# Patient Record
Sex: Female | Born: 1975 | Hispanic: No | Marital: Single | Smoking: Never smoker
Health system: Southern US, Community
[De-identification: ages and names within clinical notes are randomized; demographics above are authoritative.]

## PROBLEM LIST (undated history)

## (undated) DIAGNOSIS — J45909 Unspecified asthma, uncomplicated: Secondary | ICD-10-CM

## (undated) DIAGNOSIS — F319 Bipolar disorder, unspecified: Secondary | ICD-10-CM

## (undated) DIAGNOSIS — H8109 Meniere's disease, unspecified ear: Secondary | ICD-10-CM

## (undated) DIAGNOSIS — F32A Depression, unspecified: Secondary | ICD-10-CM

## (undated) DIAGNOSIS — D649 Anemia, unspecified: Secondary | ICD-10-CM

## (undated) DIAGNOSIS — F329 Major depressive disorder, single episode, unspecified: Secondary | ICD-10-CM

## (undated) DIAGNOSIS — F419 Anxiety disorder, unspecified: Secondary | ICD-10-CM

## (undated) DIAGNOSIS — E079 Disorder of thyroid, unspecified: Secondary | ICD-10-CM

## (undated) HISTORY — DX: Anemia, unspecified: D64.9

## (undated) HISTORY — DX: Major depressive disorder, single episode, unspecified: F32.9

## (undated) HISTORY — DX: Anxiety disorder, unspecified: F41.9

## (undated) HISTORY — DX: Depression, unspecified: F32.A

---

## 2000-11-10 ENCOUNTER — Other Ambulatory Visit: Admission: RE | Admit: 2000-11-10 | Discharge: 2000-11-10 | Payer: Self-pay | Admitting: Gynecology

## 2001-12-03 ENCOUNTER — Other Ambulatory Visit: Admission: RE | Admit: 2001-12-03 | Discharge: 2001-12-03 | Payer: Self-pay | Admitting: Gynecology

## 2001-12-16 ENCOUNTER — Encounter: Admission: RE | Admit: 2001-12-16 | Discharge: 2002-03-16 | Payer: Self-pay | Admitting: Gynecology

## 2003-06-21 ENCOUNTER — Other Ambulatory Visit: Admission: RE | Admit: 2003-06-21 | Discharge: 2003-06-21 | Payer: Self-pay | Admitting: Gynecology

## 2004-03-04 ENCOUNTER — Emergency Department (HOSPITAL_COMMUNITY): Admission: EM | Admit: 2004-03-04 | Discharge: 2004-03-04 | Payer: Self-pay

## 2005-01-17 ENCOUNTER — Other Ambulatory Visit: Admission: RE | Admit: 2005-01-17 | Discharge: 2005-01-17 | Payer: Self-pay | Admitting: Gynecology

## 2006-02-27 ENCOUNTER — Other Ambulatory Visit: Admission: RE | Admit: 2006-02-27 | Discharge: 2006-02-27 | Payer: Self-pay | Admitting: Gynecology

## 2017-03-22 ENCOUNTER — Encounter (HOSPITAL_COMMUNITY): Payer: Self-pay

## 2017-03-22 ENCOUNTER — Inpatient Hospital Stay (HOSPITAL_COMMUNITY)
Admission: EM | Admit: 2017-03-22 | Discharge: 2017-03-26 | DRG: 418 | Disposition: A | Discharge status: Home / Self Care | Payer: No Typology Code available for payment source | Attending: Surgery | Admitting: Surgery

## 2017-03-22 ENCOUNTER — Emergency Department (HOSPITAL_COMMUNITY): Payer: No Typology Code available for payment source

## 2017-03-22 DIAGNOSIS — K760 Fatty (change of) liver, not elsewhere classified: Secondary | ICD-10-CM | POA: Diagnosis present

## 2017-03-22 DIAGNOSIS — R1010 Upper abdominal pain, unspecified: Secondary | ICD-10-CM | POA: Diagnosis not present

## 2017-03-22 DIAGNOSIS — J45909 Unspecified asthma, uncomplicated: Secondary | ICD-10-CM | POA: Diagnosis present

## 2017-03-22 DIAGNOSIS — E039 Hypothyroidism, unspecified: Secondary | ICD-10-CM | POA: Diagnosis present

## 2017-03-22 DIAGNOSIS — K8000 Calculus of gallbladder with acute cholecystitis without obstruction: Principal | ICD-10-CM | POA: Diagnosis present

## 2017-03-22 DIAGNOSIS — F319 Bipolar disorder, unspecified: Secondary | ICD-10-CM | POA: Diagnosis present

## 2017-03-22 DIAGNOSIS — K819 Cholecystitis, unspecified: Secondary | ICD-10-CM | POA: Diagnosis present

## 2017-03-22 DIAGNOSIS — K810 Acute cholecystitis: Secondary | ICD-10-CM

## 2017-03-22 DIAGNOSIS — R110 Nausea: Secondary | ICD-10-CM

## 2017-03-22 DIAGNOSIS — Z6841 Body Mass Index (BMI) 40.0 and over, adult: Secondary | ICD-10-CM

## 2017-03-22 DIAGNOSIS — Z885 Allergy status to narcotic agent status: Secondary | ICD-10-CM

## 2017-03-22 HISTORY — DX: Unspecified asthma, uncomplicated: J45.909

## 2017-03-22 HISTORY — DX: Bipolar disorder, unspecified: F31.9

## 2017-03-22 HISTORY — DX: Disorder of thyroid, unspecified: E07.9

## 2017-03-22 LAB — URINALYSIS, ROUTINE W REFLEX MICROSCOPIC
Bilirubin Urine: NEGATIVE
GLUCOSE, UA: NEGATIVE mg/dL
HGB URINE DIPSTICK: NEGATIVE
Ketones, ur: NEGATIVE mg/dL
LEUKOCYTES UA: NEGATIVE
Nitrite: NEGATIVE
PROTEIN: NEGATIVE mg/dL
SPECIFIC GRAVITY, URINE: 1.013 (ref 1.005–1.030)
pH: 7 (ref 5.0–8.0)

## 2017-03-22 LAB — CBC
HEMATOCRIT: 40.9 % (ref 36.0–46.0)
HEMOGLOBIN: 13.7 g/dL (ref 12.0–15.0)
MCH: 29.8 pg (ref 26.0–34.0)
MCHC: 33.5 g/dL (ref 30.0–36.0)
MCV: 88.9 fL (ref 78.0–100.0)
PLATELETS: 316 10*3/uL (ref 150–400)
RBC: 4.6 MIL/uL (ref 3.87–5.11)
RDW: 13 % (ref 11.5–15.5)
WBC: 14.8 10*3/uL — AB (ref 4.0–10.5)

## 2017-03-22 LAB — COMPREHENSIVE METABOLIC PANEL
ALT: 38 U/L (ref 14–54)
ANION GAP: 10 (ref 5–15)
AST: 27 U/L (ref 15–41)
Albumin: 4 g/dL (ref 3.5–5.0)
Alkaline Phosphatase: 89 U/L (ref 38–126)
BUN: 5 mg/dL — ABNORMAL LOW (ref 6–20)
CHLORIDE: 109 mmol/L (ref 101–111)
CO2: 18 mmol/L — AB (ref 22–32)
CREATININE: 0.84 mg/dL (ref 0.44–1.00)
Calcium: 9.6 mg/dL (ref 8.9–10.3)
Glucose, Bld: 105 mg/dL — ABNORMAL HIGH (ref 65–99)
POTASSIUM: 4 mmol/L (ref 3.5–5.1)
SODIUM: 137 mmol/L (ref 135–145)
Total Bilirubin: 0.6 mg/dL (ref 0.3–1.2)
Total Protein: 7.2 g/dL (ref 6.5–8.1)

## 2017-03-22 LAB — LITHIUM LEVEL: LITHIUM LVL: 0.58 mmol/L — AB (ref 0.60–1.20)

## 2017-03-22 LAB — I-STAT BETA HCG BLOOD, ED (MC, WL, AP ONLY)

## 2017-03-22 LAB — LIPASE, BLOOD: LIPASE: 26 U/L (ref 11–51)

## 2017-03-22 MED ORDER — FENTANYL CITRATE (PF) 100 MCG/2ML IJ SOLN
50.0000 ug | Freq: Once | INTRAMUSCULAR | Status: AC
  Administered 2017-03-22: 50 ug via INTRAVENOUS
  Filled 2017-03-22: qty 2

## 2017-03-22 MED ORDER — GI COCKTAIL ~~LOC~~
30.0000 mL | Freq: Once | ORAL | Status: AC
  Administered 2017-03-22: 30 mL via ORAL
  Filled 2017-03-22: qty 30

## 2017-03-22 MED ORDER — SODIUM CHLORIDE 0.9 % IV BOLUS (SEPSIS)
1000.0000 mL | Freq: Once | INTRAVENOUS | Status: AC
  Administered 2017-03-22: 1000 mL via INTRAVENOUS

## 2017-03-22 MED ORDER — ONDANSETRON HCL 4 MG/2ML IJ SOLN
4.0000 mg | Freq: Once | INTRAMUSCULAR | Status: AC
  Administered 2017-03-22: 4 mg via INTRAVENOUS
  Filled 2017-03-22: qty 2

## 2017-03-22 NOTE — ED Notes (Signed)
Patient transported to Ultrasound 

## 2017-03-22 NOTE — ED Triage Notes (Signed)
Onset 1 week abd pain, vomiting, diarrhea.   No vomiting since onset, no diarrhea in 3 days. Abd pain worse in past 3 days, made worse with eating.

## 2017-03-22 NOTE — ED Notes (Signed)
Patient updated on delays 

## 2017-03-22 NOTE — ED Provider Notes (Signed)
[facility]-EMERGENCY DEPT Provider Note   CSN: [id number] Arrival date & time: 03/22/17  1734     History   Chief Complaint Chief Complaint  Patient presents with  . Abdominal Pain    HPI Jenna Navarro is a 41 y.o. female.  Jenna Navarro is a 41 y.o. Female who presents to the emergency department complaining of upper abdominal pain with associated nausea and vomiting has been ongoing. Patient reports she's been having intermittent abdominal pain for several weeks it has worsened over the past week. She was last week she had nausea, vomiting diarrhea that is now resolved. She reports over the past week she's had nausea, but no vomiting. She was after eating she feels upper abdominal pain with associated nausea and satiety. Denies any previous abdominal surgeries. She denies any burping or belching. She reports taking his Zantac earlier today with little relief of her symptoms. She denies fevers, chest pain, shortness of breath, hematemesis, vomiting, diarrhea, rashes, urinary symptoms, lower abdominal pain.    The history is provided by the patient, medical records and a parent. No language interpreter was used.  Abdominal Pain   Associated symptoms include nausea. Pertinent negatives include fever, diarrhea, vomiting, dysuria, frequency and headaches.    Past Medical History:  Diagnosis Date  . Asthma   . Bipolar 1 disorder (HCC)   . Thyroid disease    hashimoto     Patient Active Problem List   Diagnosis Date Noted  . Cholecystitis 03/23/2017    History reviewed. No pertinent surgical history.  OB History    No data available       Home Medications    Prior to Admission medications   Medication Sig Start Date End Date Taking? Authorizing Provider  ADVAIR DISKUS 250-50 MCG/DOSE AEPB Inhale 1 puff into the lungs 2 (two) times daily. 03/07/17  Yes [provider]  albuterol (PROVENTIL HFA;VENTOLIN HFA) 108 (90 Base) MCG/ACT inhaler Inhale 1-2 puffs into the  lungs every 6 (six) hours as needed for wheezing or shortness of breath.   Yes [provider]  ARIPiprazole (ABILIFY) 10 MG tablet Take 10 mg by mouth at bedtime. 02/05/17  Yes [provider]  Armodafinil 250 MG tablet Take 250 mg by mouth daily. 03/16/17  Yes [provider]  lamoTRIgine (LAMICTAL) 100 MG tablet Take 300 mg by mouth at bedtime. 02/05/17  Yes [provider]  levothyroxine (SYNTHROID, LEVOTHROID) 150 MCG tablet Take 150 mcg by mouth daily. 03/10/17  Yes [provider]  lithium carbonate (ESKALITH) 450 MG CR tablet Take 1,350 mg by mouth at bedtime. 03/07/17  Yes [provider]  minocycline (DYNACIN) 100 MG tablet Take 100 mg by mouth 2 (two) times daily.   Yes [provider]  montelukast (SINGULAIR) 10 MG tablet Take 10 mg by mouth daily. 01/17/17  Yes [provider]  NORTREL 1/35, 28, tablet Take 1 tablet by mouth at bedtime. 03/07/17  Yes [provider]  risperiDONE (RISPERDAL) 0.25 MG tablet Take 0.75 mg by mouth at bedtime. 02/05/17  Yes [provider]    Family History History reviewed. No pertinent family history.  Social History Social History  Substance Use Topics  . Smoking status: Never Smoker  . Smokeless tobacco: Never Used  . Alcohol use No     Allergies   Codeine   Review of Systems Review of Systems  Constitutional: Negative for chills and fever.  HENT: Negative for congestion and sore throat.   Eyes: Negative  for visual disturbance.  Respiratory: Negative for cough, shortness of breath and wheezing.   Cardiovascular: Negative for chest pain and palpitations.  Gastrointestinal: Positive for abdominal pain and nausea. Negative for blood in stool, diarrhea and vomiting.  Genitourinary: Negative for difficulty urinating, dysuria, frequency and urgency.  Musculoskeletal: Negative for neck pain.  Skin: Negative for rash.  Neurological: Negative for headaches.      Physical Exam Updated Vital Signs BP (!) 149/94   Pulse 79   Temp 98.3 F (36.8 C) (Oral)   Resp 18   Ht 5\' 6"  (1.676 m)   Wt 119.3 kg (263 lb)   LMP 03/10/2017   SpO2 99%   BMI 42.45 kg/m   Physical Exam  Constitutional: She appears well-developed and well-nourished. No distress.  Nontoxic-appearing. Obese female.  HENT:  Head: Normocephalic and atraumatic.  Mouth/Throat: Oropharynx is clear and moist.  Eyes: Pupils are equal, round, and reactive to light. Conjunctivae are normal. Right eye exhibits no discharge. Left eye exhibits no discharge.  Neck: Neck supple. No JVD present.  Cardiovascular: Normal rate, regular rhythm, normal heart sounds and intact distal pulses.  Exam reveals no gallop and no friction rub.   No murmur heard. Pulmonary/Chest: Effort normal and breath sounds normal. No stridor. No respiratory distress. She has no wheezes. She has no rales.  Abdominal: Soft. Bowel sounds are normal. She exhibits no distension and no mass. There is tenderness. There is no rebound and no guarding.  Abdomen is soft. Bowel sounds are present. Patient has epigastric and right upper quadrant abdominal tenderness to palpation. No lower abdominal tenderness. No CVA or flank tenderness.  Musculoskeletal: She exhibits no edema.  Lymphadenopathy:    She has no cervical adenopathy.  Neurological: She is alert. Coordination normal.  Skin: Skin is warm and dry. Capillary refill takes less than 2 seconds. No rash noted. She is not diaphoretic. No erythema. No pallor.  Psychiatric: She has a normal mood and affect. Her behavior is normal.  Nursing note and vitals reviewed.    ED Treatments / Results  Labs (all labs ordered are listed, but only abnormal results are displayed) Labs Reviewed  COMPREHENSIVE METABOLIC PANEL - Abnormal; Notable for the following:       Result Value   CO2 18 (*)    Glucose, Bld 105 (*)    BUN 5 (*)    All other components within normal limits   CBC - Abnormal; Notable for the following:    WBC 14.8 (*)    All other components within normal limits  LITHIUM LEVEL - Abnormal; Notable for the following:    Lithium Lvl 0.58 (*)    All other components within normal limits  LIPASE, BLOOD  URINALYSIS, ROUTINE W REFLEX MICROSCOPIC  I-STAT BETA HCG BLOOD, ED ([facility], [facility], [facility] ONLY)    EKG  EKG Interpretation None       Radiology US Abdomen Limited  Result Date: 03/22/2017 CLINICAL DATA:  Epigastric and right upper quadrant abdominal pain. Nausea and vomiting. EXAM: ULTRASOUND ABDOMEN LIMITED RIGHT UPPER QUADRANT COMPARISON:  None. FINDINGS: Gallbladder: Distended containing intraluminal stones and sludge. There is gallbladder wall thickening of 5 mm. Small amount pericholecystic fluid. A positive sonographic Eulah Pont sign was noted by sonographer. Common bile duct: Diameter: 5 mm, normal. Liver: Diffusely increased and heterogeneous in parenchymal echogenicity. Liver parenchyma is difficult to penetrate. No discrete focal lesion is seen. Portal vein is patent on color Doppler imaging with normal direction of blood flow towards the liver.  IMPRESSION: 1. Acute cholecystitis. Cholelithiasis with gallbladder wall thickening, small volume pericholecystic fluid and positive sonographic Murphy sign. 2. Hepatic steatosis. Electronically Signed   By: Rubye [place] M.D.   On: 03/22/2017 23:28    Procedures Procedures (including critical care time)  Medications Ordered in ED Medications  ARIPiprazole (ABILIFY) tablet 10 mg (10 mg Oral Given 03/23/17 0114)  lamoTRIgine (LAMICTAL) tablet 300 mg (300 mg Oral Given 03/23/17 0114)  risperiDONE (RISPERDAL) tablet 0.75 mg (0.75 mg Oral Given 03/23/17 0114)  sodium chloride 0.9 % bolus 1,000 mL (1,000 mLs Intravenous New Bag/Given 03/22/17 2355)  ondansetron (ZOFRAN) injection 4 mg (4 mg Intravenous Given 03/22/17 2355)  fentaNYL (SUBLIMAZE) injection 50 mcg (50 mcg Intravenous Given 03/22/17 2356)  gi  cocktail (Maalox,Lidocaine,Donnatal) (30 mLs Oral Given 03/22/17 2348)     Initial Impression / Assessment and Plan / ED Course  I have reviewed the triage vital signs and the nursing notes.  Pertinent labs & imaging results that were available during my care of the patient were reviewed by me and considered in my medical decision making (see chart for details).    This is a 41 y.o. Female who presents to the emergency department complaining of upper abdominal pain with associated nausea and vomiting has been ongoing. Patient reports she's been having intermittent abdominal pain for several weeks it has worsened over the past week. She was last week she had nausea, vomiting diarrhea that is now resolved. She reports over the past week she's had nausea, but no vomiting. She was after eating she feels upper abdominal pain with associated nausea and satiety. Denies any previous abdominal surgeries. On exam patient is afebrile nontoxic-appearing. She is epigastric and right upper quadrant tenderness to palpation. Pregnancy test is negative. Urinalysis without signs of infection. Lipase is within normal limits. CMP is unremarkable. Normal liver enzymes. CBC is remarkable for white count of 14,800. Right upper quadrant ultrasound reveals acute cholecystitis with cholelithiasis. I consulted with general surgeon Dr. Andrey Campanile who will admit patient and plans for surgery. Patient agrees with plan. Patient admitted.   Final Clinical Impressions(s) / ED Diagnoses   Final diagnoses:  Acute cholecystitis  Nausea    New Prescriptions New Prescriptions   No medications on file     Lorene Dy 03/23/17 0139    Rolland Porter, MD 04/01/17 [phone]

## 2017-03-22 NOTE — ED Notes (Signed)
Pt concerned with Lithium level.  Has not had checked in 3 months and would like checked today.  Called Dr. Hyacinth Meeker, order obtained.

## 2017-03-23 ENCOUNTER — Encounter (HOSPITAL_COMMUNITY): Admission: EM | Disposition: A | Discharge status: Home / Self Care | Payer: Self-pay | Source: Home / Self Care

## 2017-03-23 ENCOUNTER — Observation Stay (HOSPITAL_COMMUNITY): Payer: No Typology Code available for payment source | Admitting: Anesthesiology

## 2017-03-23 DIAGNOSIS — J45909 Unspecified asthma, uncomplicated: Secondary | ICD-10-CM | POA: Diagnosis present

## 2017-03-23 DIAGNOSIS — E039 Hypothyroidism, unspecified: Secondary | ICD-10-CM | POA: Diagnosis present

## 2017-03-23 DIAGNOSIS — K819 Cholecystitis, unspecified: Secondary | ICD-10-CM | POA: Diagnosis present

## 2017-03-23 DIAGNOSIS — Z6841 Body Mass Index (BMI) 40.0 and over, adult: Secondary | ICD-10-CM | POA: Diagnosis not present

## 2017-03-23 DIAGNOSIS — K8000 Calculus of gallbladder with acute cholecystitis without obstruction: Secondary | ICD-10-CM | POA: Diagnosis present

## 2017-03-23 DIAGNOSIS — K760 Fatty (change of) liver, not elsewhere classified: Secondary | ICD-10-CM | POA: Diagnosis present

## 2017-03-23 DIAGNOSIS — F319 Bipolar disorder, unspecified: Secondary | ICD-10-CM | POA: Diagnosis present

## 2017-03-23 DIAGNOSIS — Z885 Allergy status to narcotic agent status: Secondary | ICD-10-CM | POA: Diagnosis not present

## 2017-03-23 DIAGNOSIS — R1010 Upper abdominal pain, unspecified: Secondary | ICD-10-CM | POA: Diagnosis present

## 2017-03-23 HISTORY — PX: CHOLECYSTECTOMY: SHX55

## 2017-03-23 LAB — COMPREHENSIVE METABOLIC PANEL
ALBUMIN: 3.5 g/dL (ref 3.5–5.0)
ALK PHOS: 125 U/L (ref 38–126)
ALT: 45 U/L (ref 14–54)
AST: 61 U/L — ABNORMAL HIGH (ref 15–41)
Anion gap: 8 (ref 5–15)
BILIRUBIN TOTAL: 0.9 mg/dL (ref 0.3–1.2)
CALCIUM: 8.9 mg/dL (ref 8.9–10.3)
CO2: 19 mmol/L — AB (ref 22–32)
CREATININE: 0.73 mg/dL (ref 0.44–1.00)
Chloride: 108 mmol/L (ref 101–111)
GFR calc Af Amer: >60 mL/min (ref >60)
GFR calc non Af Amer: >60 mL/min (ref >60)
GLUCOSE: 134 mg/dL — AB (ref 65–99)
Potassium: 4 mmol/L (ref 3.5–5.1)
SODIUM: 135 mmol/L (ref 135–145)
Total Protein: 6.7 g/dL (ref 6.5–8.1)

## 2017-03-23 LAB — SURGICAL PCR SCREEN
MRSA, PCR: NEGATIVE
Staphylococcus aureus: NEGATIVE

## 2017-03-23 LAB — CBC
HCT: 40.2 % (ref 36.0–46.0)
Hemoglobin: 13 g/dL (ref 12.0–15.0)
MCH: 29.5 pg (ref 26.0–34.0)
MCHC: 32.3 g/dL (ref 30.0–36.0)
MCV: 91.4 fL (ref 78.0–100.0)
PLATELETS: 257 10*3/uL (ref 150–400)
RBC: 4.4 MIL/uL (ref 3.87–5.11)
RDW: 13.2 % (ref 11.5–15.5)
WBC: 13.6 10*3/uL — ABNORMAL HIGH (ref 4.0–10.5)

## 2017-03-23 SURGERY — LAPAROSCOPIC CHOLECYSTECTOMY
Anesthesia: General | Site: Abdomen

## 2017-03-23 MED ORDER — LITHIUM CARBONATE ER 450 MG PO TBCR: 1350.0000 mg | EXTENDED_RELEASE_TABLET | Freq: Every day | ORAL | Status: DC

## 2017-03-23 MED ORDER — KCL IN DEXTROSE-NACL 20-5-0.45 MEQ/L-%-% IV SOLN
INTRAVENOUS | Status: DC
  Administered 2017-03-23: 03:00:00 via INTRAVENOUS
  Filled 2017-03-23: qty 1000

## 2017-03-23 MED ORDER — FENTANYL CITRATE (PF) 100 MCG/2ML IJ SOLN
25.0000 ug | INTRAMUSCULAR | Status: DC | PRN
  Administered 2017-03-23 (×2): 50 ug via INTRAVENOUS
  Filled 2017-03-23 (×2): qty 2

## 2017-03-23 MED ORDER — ARIPIPRAZOLE 10 MG PO TABS
10.0000 mg | ORAL_TABLET | Freq: Every day | ORAL | Status: DC
  Administered 2017-03-23 – 2017-03-25 (×4): 10 mg via ORAL
  Filled 2017-03-23 (×4): qty 1

## 2017-03-23 MED ORDER — LIDOCAINE HCL (CARDIAC) 20 MG/ML IV SOLN
INTRAVENOUS | Status: DC | PRN
  Administered 2017-03-23: 60 mg via INTRAVENOUS

## 2017-03-23 MED ORDER — SUGAMMADEX SODIUM 200 MG/2ML IV SOLN
INTRAVENOUS | Status: AC
  Filled 2017-03-23: qty 2

## 2017-03-23 MED ORDER — BUPIVACAINE-EPINEPHRINE 0.25% -1:200000 IJ SOLN
INTRAMUSCULAR | Status: AC
  Filled 2017-03-23: qty 1

## 2017-03-23 MED ORDER — FENTANYL CITRATE (PF) 250 MCG/5ML IJ SOLN
INTRAMUSCULAR | Status: AC
  Filled 2017-03-23: qty 5

## 2017-03-23 MED ORDER — IOPAMIDOL (ISOVUE-300) INJECTION 61%
INTRAVENOUS | Status: AC
  Filled 2017-03-23: qty 50

## 2017-03-23 MED ORDER — HYDROMORPHONE HCL 1 MG/ML IJ SOLN
1.0000 mg | INTRAMUSCULAR | Status: DC | PRN
  Administered 2017-03-23 – 2017-03-25 (×7): 1 mg via INTRAVENOUS
  Filled 2017-03-23 (×7): qty 1

## 2017-03-23 MED ORDER — SODIUM CHLORIDE 0.9 % IR SOLN
Status: DC | PRN
  Administered 2017-03-23: 1000 mL

## 2017-03-23 MED ORDER — ROCURONIUM BROMIDE 10 MG/ML (PF) SYRINGE
PREFILLED_SYRINGE | INTRAVENOUS | Status: AC
  Filled 2017-03-23: qty 5

## 2017-03-23 MED ORDER — ONDANSETRON HCL 4 MG/2ML IJ SOLN: 4.0000 mg | Freq: Four times a day (QID) | INTRAMUSCULAR | Status: DC

## 2017-03-23 MED ORDER — ONDANSETRON HCL 4 MG/2ML IJ SOLN
4.0000 mg | Freq: Four times a day (QID) | INTRAMUSCULAR | Status: DC | PRN
Start: 2017-03-23 — End: 2017-03-26

## 2017-03-23 MED ORDER — 0.9 % SODIUM CHLORIDE (POUR BTL) OPTIME
TOPICAL | Status: DC | PRN
  Administered 2017-03-23: 1000 mL

## 2017-03-23 MED ORDER — RISPERIDONE 0.5 MG PO TABS
0.7500 mg | ORAL_TABLET | Freq: Every day | ORAL | Status: DC
  Administered 2017-03-23 – 2017-03-25 (×4): 0.75 mg via ORAL
  Filled 2017-03-23 (×5): qty 1

## 2017-03-23 MED ORDER — FAMOTIDINE 20 MG PO TABS
20.0000 mg | ORAL_TABLET | Freq: Once | ORAL | Status: AC
  Administered 2017-03-23: 20 mg via ORAL
  Filled 2017-03-23: qty 1

## 2017-03-23 MED ORDER — DIPHENHYDRAMINE HCL 12.5 MG/5ML PO ELIX: 12.5000 mg | ORAL_SOLUTION | Freq: Four times a day (QID) | ORAL | Status: DC | PRN

## 2017-03-23 MED ORDER — LITHIUM CARBONATE ER 450 MG PO TBCR
1350.0000 mg | EXTENDED_RELEASE_TABLET | Freq: Every day | ORAL | Status: DC
  Administered 2017-03-23 – 2017-03-25 (×3): 1350 mg via ORAL
  Filled 2017-03-23 (×4): qty 3

## 2017-03-23 MED ORDER — FENTANYL CITRATE (PF) 100 MCG/2ML IJ SOLN
INTRAMUSCULAR | Status: DC | PRN
  Administered 2017-03-23: 150 ug via INTRAVENOUS
  Administered 2017-03-23 (×2): 50 ug via INTRAVENOUS

## 2017-03-23 MED ORDER — PIPERACILLIN-TAZOBACTAM 3.375 G IVPB 30 MIN
3.3750 g | Freq: Once | INTRAVENOUS | Status: AC
  Administered 2017-03-23: 3.375 g via INTRAVENOUS
  Filled 2017-03-23 (×2): qty 50

## 2017-03-23 MED ORDER — ACETAMINOPHEN 650 MG RE SUPP: 650.0000 mg | Freq: Four times a day (QID) | RECTAL | Status: DC | PRN

## 2017-03-23 MED ORDER — PROMETHAZINE HCL 25 MG/ML IJ SOLN: 6.2500 mg | INTRAMUSCULAR | Status: DC | PRN

## 2017-03-23 MED ORDER — HYDROMORPHONE HCL 1 MG/ML IJ SOLN
INTRAMUSCULAR | Status: AC
  Administered 2017-03-23: 0.5 mg via INTRAVENOUS
  Filled 2017-03-23: qty 1

## 2017-03-23 MED ORDER — DEXAMETHASONE SODIUM PHOSPHATE 10 MG/ML IJ SOLN
INTRAMUSCULAR | Status: DC | PRN
  Administered 2017-03-23: 10 mg via INTRAVENOUS

## 2017-03-23 MED ORDER — PROMETHAZINE HCL 25 MG/ML IJ SOLN: 12.5000 mg | Freq: Four times a day (QID) | INTRAMUSCULAR | Status: DC | PRN

## 2017-03-23 MED ORDER — MIDAZOLAM HCL 5 MG/5ML IJ SOLN
INTRAMUSCULAR | Status: DC | PRN
  Administered 2017-03-23: 2 mg via INTRAVENOUS

## 2017-03-23 MED ORDER — OXYCODONE-ACETAMINOPHEN 5-325 MG PO TABS
1.0000 | ORAL_TABLET | ORAL | Status: DC | PRN
  Administered 2017-03-23 – 2017-03-26 (×8): 1 via ORAL
  Filled 2017-03-23 (×10): qty 1

## 2017-03-23 MED ORDER — PIPERACILLIN-TAZOBACTAM 3.375 G IVPB
3.3750 g | Freq: Three times a day (TID) | INTRAVENOUS | Status: DC
  Administered 2017-03-23: 3.375 g via INTRAVENOUS
  Filled 2017-03-23 (×3): qty 50

## 2017-03-23 MED ORDER — PHENYLEPHRINE HCL 10 MG/ML IJ SOLN
INTRAMUSCULAR | Status: DC | PRN
  Administered 2017-03-23: 80 ug via INTRAVENOUS
  Administered 2017-03-23 (×2): 40 ug via INTRAVENOUS
  Administered 2017-03-23: 80 ug via INTRAVENOUS
  Administered 2017-03-23 (×2): 40 ug via INTRAVENOUS

## 2017-03-23 MED ORDER — PROPOFOL 10 MG/ML IV BOLUS
INTRAVENOUS | Status: DC | PRN
  Administered 2017-03-23: 200 mg via INTRAVENOUS

## 2017-03-23 MED ORDER — HEPARIN SODIUM (PORCINE) 5000 UNIT/ML IJ SOLN
5000.0000 [IU] | Freq: Once | INTRAMUSCULAR | Status: AC
  Administered 2017-03-23: 5000 [IU] via SUBCUTANEOUS
  Filled 2017-03-23: qty 1

## 2017-03-23 MED ORDER — ROCURONIUM BROMIDE 100 MG/10ML IV SOLN
INTRAVENOUS | Status: DC | PRN
  Administered 2017-03-23: 10 mg via INTRAVENOUS
  Administered 2017-03-23: 40 mg via INTRAVENOUS
  Administered 2017-03-23: 10 mg via INTRAVENOUS

## 2017-03-23 MED ORDER — LAMOTRIGINE 100 MG PO TABS
300.0000 mg | ORAL_TABLET | Freq: Every day | ORAL | Status: DC
  Administered 2017-03-23 – 2017-03-25 (×4): 300 mg via ORAL
  Filled 2017-03-23: qty 3
  Filled 2017-03-23: qty 2
  Filled 2017-03-23 (×2): qty 3

## 2017-03-23 MED ORDER — PANTOPRAZOLE SODIUM 40 MG IV SOLR: 40.0000 mg | Freq: Every day | INTRAVENOUS | Status: DC

## 2017-03-23 MED ORDER — SUGAMMADEX SODIUM 200 MG/2ML IV SOLN
INTRAVENOUS | Status: DC | PRN
  Administered 2017-03-23: 200 mg via INTRAVENOUS

## 2017-03-23 MED ORDER — ONDANSETRON 4 MG PO TBDP: 4.0000 mg | ORAL_TABLET | Freq: Four times a day (QID) | ORAL | Status: DC | PRN

## 2017-03-23 MED ORDER — ACETAMINOPHEN 325 MG PO TABS: 650.0000 mg | ORAL_TABLET | Freq: Four times a day (QID) | ORAL | Status: DC | PRN

## 2017-03-23 MED ORDER — ALBUTEROL SULFATE (2.5 MG/3ML) 0.083% IN NEBU: 3.0000 mL | INHALATION_SOLUTION | Freq: Four times a day (QID) | RESPIRATORY_TRACT | Status: DC | PRN

## 2017-03-23 MED ORDER — PHENYLEPHRINE 40 MCG/ML (10ML) SYRINGE FOR IV PUSH (FOR BLOOD PRESSURE SUPPORT)
PREFILLED_SYRINGE | INTRAVENOUS | Status: AC
  Filled 2017-03-23: qty 10

## 2017-03-23 MED ORDER — DIPHENHYDRAMINE HCL 50 MG/ML IJ SOLN: 12.5000 mg | Freq: Four times a day (QID) | INTRAMUSCULAR | Status: DC | PRN

## 2017-03-23 MED ORDER — HYDROMORPHONE HCL 1 MG/ML IJ SOLN
0.2500 mg | INTRAMUSCULAR | Status: DC | PRN
  Administered 2017-03-23 (×2): 0.5 mg via INTRAVENOUS

## 2017-03-23 MED ORDER — BUPIVACAINE-EPINEPHRINE 0.25% -1:200000 IJ SOLN
INTRAMUSCULAR | Status: DC | PRN
  Administered 2017-03-23: 10 mL

## 2017-03-23 MED ORDER — LIDOCAINE 2% (20 MG/ML) 5 ML SYRINGE
INTRAMUSCULAR | Status: AC
  Filled 2017-03-23: qty 5

## 2017-03-23 MED ORDER — ONDANSETRON HCL 4 MG/2ML IJ SOLN
4.0000 mg | Freq: Four times a day (QID) | INTRAMUSCULAR | Status: DC | PRN
  Administered 2017-03-23: 4 mg via INTRAVENOUS
  Filled 2017-03-23: qty 2

## 2017-03-23 MED ORDER — MIDAZOLAM HCL 2 MG/2ML IJ SOLN
INTRAMUSCULAR | Status: AC
  Filled 2017-03-23: qty 2

## 2017-03-23 MED ORDER — LACTATED RINGERS IV SOLN
INTRAVENOUS | Status: DC
  Administered 2017-03-23 – 2017-03-24 (×5): via INTRAVENOUS

## 2017-03-23 MED ORDER — SUCCINYLCHOLINE CHLORIDE 20 MG/ML IJ SOLN
INTRAMUSCULAR | Status: DC | PRN
  Administered 2017-03-23: 160 mg via INTRAVENOUS

## 2017-03-23 SURGICAL SUPPLY — 42 items
ADH SKN CLS APL DERMABOND .7 (GAUZE/BANDAGES/DRESSINGS) ×1
APPLIER CLIP ROT 10 11.4 M/L (STAPLE) ×2
APR CLP MED LRG 11.4X10 (STAPLE) ×1
BAG SPEC RTRVL LRG 6X4 10 (ENDOMECHANICALS) ×1
BLADE CLIPPER SURG (BLADE) ×1 IMPLANT
CANISTER SUCT 3000ML PPV (MISCELLANEOUS) ×2 IMPLANT
CHLORAPREP W/TINT 26ML (MISCELLANEOUS) ×2 IMPLANT
CLIP APPLIE ROT 10 11.4 M/L (STAPLE) ×1 IMPLANT
COVER SURGICAL LIGHT HANDLE (MISCELLANEOUS) ×2 IMPLANT
DERMABOND ADVANCED (GAUZE/BANDAGES/DRESSINGS) ×1
DERMABOND ADVANCED .7 DNX12 (GAUZE/BANDAGES/DRESSINGS) ×1 IMPLANT
DRAIN CHANNEL 19F RND (DRAIN) ×1 IMPLANT
DRAPE WARM FLUID 44X44 (DRAPE) ×2 IMPLANT
ELECT REM PT RETURN 9FT ADLT (ELECTROSURGICAL) ×2
ELECTRODE REM PT RTRN 9FT ADLT (ELECTROSURGICAL) ×1 IMPLANT
EVACUATOR SILICONE 100CC (DRAIN) ×1 IMPLANT
GLOVE BIO SURGEON STRL SZ8 (GLOVE) ×2 IMPLANT
GLOVE BIOGEL PI IND STRL 8 (GLOVE) ×1 IMPLANT
GLOVE BIOGEL PI INDICATOR 8 (GLOVE) ×1
GOWN STRL REUS W/ TWL LRG LVL3 (GOWN DISPOSABLE) ×2 IMPLANT
GOWN STRL REUS W/ TWL XL LVL3 (GOWN DISPOSABLE) ×1 IMPLANT
GOWN STRL REUS W/TWL LRG LVL3 (GOWN DISPOSABLE) ×4
GOWN STRL REUS W/TWL XL LVL3 (GOWN DISPOSABLE) ×2
HEMOSTAT SNOW SURGICEL 2X4 (HEMOSTASIS) ×2 IMPLANT
KIT BASIN OR (CUSTOM PROCEDURE TRAY) ×2 IMPLANT
KIT ROOM TURNOVER OR (KITS) ×2 IMPLANT
NS IRRIG 1000ML POUR BTL (IV SOLUTION) ×2 IMPLANT
PAD ARMBOARD 7.5X6 YLW CONV (MISCELLANEOUS) ×2 IMPLANT
POUCH SPECIMEN RETRIEVAL 10MM (ENDOMECHANICALS) ×2 IMPLANT
SCISSORS LAP 5X35 DISP (ENDOMECHANICALS) ×2 IMPLANT
SET IRRIG TUBING LAPAROSCOPIC (IRRIGATION / IRRIGATOR) ×2 IMPLANT
SLEEVE ENDOPATH XCEL 5M (ENDOMECHANICALS) ×2 IMPLANT
SPECIMEN JAR SMALL (MISCELLANEOUS) ×2 IMPLANT
SUT ETHILON 2 0 FS 18 (SUTURE) ×1 IMPLANT
SUT MNCRL AB 4-0 PS2 18 (SUTURE) ×2 IMPLANT
TOWEL OR 17X24 6PK STRL BLUE (TOWEL DISPOSABLE) ×2 IMPLANT
TOWEL OR 17X26 10 PK STRL BLUE (TOWEL DISPOSABLE) ×2 IMPLANT
TRAY LAPAROSCOPIC MC (CUSTOM PROCEDURE TRAY) ×2 IMPLANT
TROCAR XCEL BLUNT TIP 100MML (ENDOMECHANICALS) ×2 IMPLANT
TROCAR XCEL NON-BLD 11X100MML (ENDOMECHANICALS) ×2 IMPLANT
TROCAR XCEL NON-BLD 5MMX100MML (ENDOMECHANICALS) ×2 IMPLANT
TUBING INSUFFLATION (TUBING) ×2 IMPLANT

## 2017-03-23 NOTE — Anesthesia Postprocedure Evaluation (Signed)
Anesthesia Post Note  Patient: Jenna Navarro  Procedure(s) Performed: Procedure(s) (LRB): LAPAROSCOPIC CHOLECYSTECTOMY, W/ POSS IOC (N/A)     Patient location during evaluation: PACU Anesthesia Type: General Level of consciousness: awake and alert Pain management: pain level controlled Vital Signs Assessment: post-procedure vital signs reviewed and stable Respiratory status: spontaneous breathing, nonlabored ventilation, respiratory function stable and patient connected to nasal cannula oxygen Cardiovascular status: blood pressure returned to baseline and stable Postop Assessment: no signs of nausea or vomiting Anesthetic complications: no    Last Vitals:  Vitals:   03/23/17 1553 03/23/17 1613  BP:  (!) 146/86  Pulse: 87 88  Resp: (!) 23 16  Temp: 36.5 C 36.9 C  SpO2: 95% 95%    Last Pain:  Vitals:   03/23/17 1704  TempSrc:   PainSc: Asleep                 Ryan P Ellender

## 2017-03-23 NOTE — H&P (Signed)
Jenna Navarro is an 41 y.o. female.   Chief Complaint: abd pain HPI: 41yo morbidly obese wf with asthma and bipolar dis comes to ED for persistent and worsening upper abd pain, mainly right sided. Pain started Wednesday but was a little better thurs/frid but worsened on Friday night after eating. Associated with nausea but no vomiting. Pain generally worsens after eating. No fever but subjective chills. Tried zantac Saturday AM without relief. She had a gi bug last week of n/v/d and not sure if it was related. No recent abx  Past Medical History:  Diagnosis Date  . Asthma   . Bipolar 1 disorder ([place])   . Thyroid disease    hashimoto     History reviewed. No pertinent surgical history.  History reviewed. No pertinent family history. Social History:  reports that she has never smoked. She has never used smokeless tobacco. She reports that she does not drink alcohol or use drugs.  Allergies:  Allergies  Allergen Reactions  . Codeine Shortness Of Breath    Rapid heart rate, sweating      (Not in a hospital admission)  Results for orders placed or performed during the hospital encounter of 03/22/17 (from the past 48 hour(s))  Lipase, blood     Status: None   Collection Time: 03/22/17  6:31 PM  Result Value Ref Range   Lipase 26 11 - 51 U/L  Comprehensive metabolic panel     Status: Abnormal   Collection Time: 03/22/17  6:31 PM  Result Value Ref Range   Sodium 137 135 - 145 mmol/L   Potassium 4.0 3.5 - 5.1 mmol/L   Chloride 109 101 - 111 mmol/L   CO2 18 (L) 22 - 32 mmol/L   Glucose, Bld 105 (H) 65 - 99 mg/dL   BUN 5 (L) 6 - 20 mg/dL   Creatinine, Ser 0.84 0.44 - 1.00 mg/dL   Calcium 9.6 8.9 - 10.3 mg/dL   Total Protein 7.2 6.5 - 8.1 g/dL   Albumin 4.0 3.5 - 5.0 g/dL   AST 27 15 - 41 U/L   ALT 38 14 - 54 U/L   Alkaline Phosphatase 89 38 - 126 U/L   Total Bilirubin 0.6 0.3 - 1.2 mg/dL   GFR calc non Af Amer >60 >60 mL/min   GFR calc Af Amer >60 >60 mL/min    Comment:  (NOTE) The eGFR has been calculated using the CKD EPI equation. This calculation has not been validated in all clinical situations. eGFR's persistently <60 mL/min signify possible Chronic Kidney Disease.    Anion gap 10 5 - 15  CBC     Status: Abnormal   Collection Time: 03/22/17  6:31 PM  Result Value Ref Range   WBC 14.8 (H) 4.0 - 10.5 K/uL   RBC 4.60 3.87 - 5.11 MIL/uL   Hemoglobin 13.7 12.0 - 15.0 g/dL   HCT 40.9 36.0 - 46.0 %   MCV 88.9 78.0 - 100.0 fL   MCH 29.8 26.0 - 34.0 pg   MCHC 33.5 30.0 - 36.0 g/dL   RDW 13.0 11.5 - 15.5 %   Platelets 316 150 - 400 K/uL  Lithium level     Status: Abnormal   Collection Time: 03/22/17  6:31 PM  Result Value Ref Range   Lithium Lvl 0.58 (L) 0.60 - 1.20 mmol/L  Urinalysis, Routine w reflex microscopic     Status: None   Collection Time: 03/22/17  6:34 PM  Result Value Ref Range  Color, Urine YELLOW YELLOW   APPearance CLEAR CLEAR   Specific Gravity, Urine 1.013 1.005 - 1.030   pH 7.0 5.0 - 8.0   Glucose, UA NEGATIVE NEGATIVE mg/dL   Hgb urine dipstick NEGATIVE NEGATIVE   Bilirubin Urine NEGATIVE NEGATIVE   Ketones, ur NEGATIVE NEGATIVE mg/dL   Protein, ur NEGATIVE NEGATIVE mg/dL   Nitrite NEGATIVE NEGATIVE   Leukocytes, UA NEGATIVE NEGATIVE  I-Stat beta hCG blood, ED     Status: None   Collection Time: 03/22/17  6:36 PM  Result Value Ref Range   I-stat hCG, quantitative <5.0 <5 mIU/mL   Comment 3            Comment:   GEST. AGE      CONC.  (mIU/mL)   <=1 WEEK        5 - 50     2 WEEKS       50 - 500     3 WEEKS       100 - 10,000     4 WEEKS     1,000 - 30,000        FEMALE AND NON-PREGNANT FEMALE:     LESS THAN 5 mIU/mL    US Abdomen Limited  Result Date: 03/22/2017 CLINICAL DATA:  Epigastric and right upper quadrant abdominal pain. Nausea and vomiting. EXAM: ULTRASOUND ABDOMEN LIMITED RIGHT UPPER QUADRANT COMPARISON:  None. FINDINGS: Gallbladder: Distended containing intraluminal stones and sludge. There is gallbladder  wall thickening of 5 mm. Small amount pericholecystic fluid. A positive sonographic Percell Miller sign was noted by sonographer. Common bile duct: Diameter: 5 mm, normal. Liver: Diffusely increased and heterogeneous in parenchymal echogenicity. Liver parenchyma is difficult to penetrate. No discrete focal lesion is seen. Portal vein is patent on color Doppler imaging with normal direction of blood flow towards the liver. IMPRESSION: 1. Acute cholecystitis. Cholelithiasis with gallbladder wall thickening, small volume pericholecystic fluid and positive sonographic Murphy sign. 2. Hepatic steatosis. Electronically Signed   By: Jeb Levering M.D.   On: 03/22/2017 23:28    Review of Systems  Constitutional: Negative for weight loss.  HENT: Negative for nosebleeds.   Eyes: Negative for blurred vision.  Respiratory: Negative for shortness of breath.   Cardiovascular: Negative for chest pain, palpitations, orthopnea and PND.       Denies DOE  Gastrointestinal: Positive for abdominal pain and nausea. Negative for blood in stool, heartburn, melena and vomiting.  Genitourinary: Negative for dysuria and hematuria.  Musculoskeletal: Negative.   Skin: Negative for itching and rash.  Neurological: Negative for dizziness, focal weakness, seizures, loss of consciousness and headaches.       Denies TIAs, amaurosis fugax  Endo/Heme/Allergies: Does not bruise/bleed easily.  Psychiatric/Behavioral: The patient is not nervous/anxious.        Stable on bipolar meds    Blood pressure (!) 155/87, pulse 72, temperature 98.3 F (36.8 C), temperature source Oral, resp. rate 18, height '5\' 6"'$  (1.676 m), weight 119.3 kg (263 lb), last menstrual period 03/10/2017, SpO2 100 %. Physical Exam  Vitals reviewed. Constitutional: She is oriented to person, place, and time. She appears well-developed and well-nourished. No distress.  Morbidly obese, central truncal  HENT:  Head: Normocephalic and atraumatic.  Right Ear: External  ear normal.  Left Ear: External ear normal.  Eyes: Conjunctivae are normal. No scleral icterus.  Neck: Normal range of motion. Neck supple. No tracheal deviation present. No thyromegaly present.  Cardiovascular: Normal rate and normal heart sounds.   Respiratory: Effort normal  and breath sounds normal. No stridor. No respiratory distress. She has no wheezes.  GI: Soft. She exhibits no distension. There is tenderness in the right upper quadrant. There is no rigidity, no rebound and no guarding. Hernia confirmed negative in the ventral area.    Musculoskeletal: She exhibits no edema or tenderness.  Lymphadenopathy:    She has no cervical adenopathy.  Neurological: She is alert and oriented to person, place, and time. She exhibits normal muscle tone.  Skin: Skin is warm and dry. No rash noted. She is not diaphoretic. No erythema. No pallor.  Psychiatric: She has a normal mood and affect. Her behavior is normal. Judgment and thought content normal.     Assessment/Plan Acute calculous cholecystitis Asthma Morbid obesity Bipolar dis Fatty liver  Admit for IV abx and cholecystectomy Will tentatively plan surgery later today depending on schedule Cont home psych meds Will give dose of subcu heparin now given morbid obesity and upcoming tentative surgery  Discussed with pt and mother  Leighton Ruff. Redmond Pulling, MD, [place], Bariatric, & Minimally Invasive Surgery [facility], [place]   Gayland Curry, MD 03/23/2017, 12:39 AM

## 2017-03-23 NOTE — Op Note (Signed)
Preoperative diagnosis: Acute cholecystitis  Postoperative diagnosis: Acute gangrenous cholecystitis  Procedure: Laparoscopic cholecystectomy  Surgeon: Erroll Luna M.D.  Anesthesia: Gen. with local  EBL approximately 80 mL  Drains: 19 round drain to gallbladder fossa  Specimen: Gallbladder to pathology stones  Indications for procedure: The patient 41 year old female admitted last night with acute cholecystitis. She presents today for laparoscopic cholecystectomy.The procedure has been discussed with the patient. Operative and non operative treatments have been discussed. Risks of surgery include bleeding, infection,  Common bile duct injury,  Injury to the stomach,liver, colon,small intestine, abdominal wall,  Diaphragm,  Major blood vessels,  And the need for an open procedure.  Other risks include worsening of medical problems, death,  DVT and pulmonary embolism, and cardiovascular events.   Medical options have also been discussed. The patient has been informed of long term expectations of surgery and non surgical options,  The patient agrees to proceed.       Description of procedure: The patient was met in the holding area and questions were answered. She's taken back to the operative room placed upon the OR table. After induction of general anesthesia, the abdomen was prepped and draped in sterile fashion. Timeout was done. She was on preoperative antibiotics. One similar infraumbilical incision was made. Dissection was carried to the midline fashion this was opened in the midline and the peritoneum was opened. A pursestring suture of 0 Vicryl was placed a 12 mm Hassan cannula was placed under direct vision. Pneumoperitoneum was created to 15 mmHg of CO2 pressure. Laparoscope was placed. She's placed in reverse Trendelenburg and rolled her left. The gallbladder showed signs acute gangrenous cholecystitis. 11 mm port was placed the epigastrium and 25 mm millimeter ports were placed in  the right upper quadrant. The gallbladder is decompressed with a needle. The dome was grasped and retracted the patient's right shoulder. A second graspers used to grab the infundibulum of the gallbladder and pulled the patient's right upper quadrant. The gallbladder was gangrenous in quite friable and necrotic. The tissues were difficult to handle and poor easily. There is stones removed from the gallbladder itself because the gallbladder begins to have issues and tear. I was able dissect out the cystic duct. The critical view was obtained. This was dissected out circumferentially. The gallbladder extremely friable cystic duct was quite friable. Or nature of the tissues I elected to forego intraoperative cholangiogram. The cystic artery identified and clipped. The posterior branch of the cystic artery clipped. Clips are placed across the cystic duct divided. The gallbladder was dissected off the gallbladder fossa. Posterior wall is extremely necrotic consistent with gangrene. He was removed in its entirety and placed in an Endo Catch bag. The gallbladder bed was irrigated. Clips in the cystic duct were intact as well as the cystic artery. This was well away from: Bile duct was visualized. Irrigation was suctioned out. Through a most lateral stab incision and 19 round drain was placed and secured to the skin with 2-0 nylon. This is placed to bulb suction. The gallbladder bed was made hemostatic with cautery and Surgicel. The gallbladder was removed via the umbilical port passed off the field. Fascia closed with 0 Vicryl. Gallbladder bed was reexamined there is good hemostasis without bleeding or signs of bile leakage. Excess irrigation was suctioned out. All the port removed after 4 quadrant laparoscopy revealed no other abnormality. The skin incisions are closed with 4-0 Monocryl drain was placed to bulb suction. All final counts are found to be correct.  The patient was awoke extubated taken to recovery in  satisfactory condition.

## 2017-03-23 NOTE — Transfer of Care (Signed)
Immediate Anesthesia Transfer of Care Note  Patient: Jenna Navarro  Procedure(s) Performed: Procedure(s): LAPAROSCOPIC CHOLECYSTECTOMY, W/ POSS IOC (N/A)  Patient Location: PACU  Anesthesia Type:General  Level of Consciousness: awake, sedated and drowsy  Airway & Oxygen Therapy: Patient Spontanous Breathing and Patient connected to face mask oxygen  Post-op Assessment: Report given to RN, Post -op Vital signs reviewed and stable, Patient moving all extremities and Patient moving all extremities X 4  Post vital signs: Reviewed and stable  Last Vitals:  Vitals:   03/23/17 1255 03/23/17 1457  BP: (!) 143/67 139/78  Pulse: 82 94  Resp: 18 19  Temp: 37.2 C   SpO2: 95% 97%    Last Pain:  Vitals:   03/23/17 1255  TempSrc: Oral  PainSc:          Complications: No apparent anesthesia complications

## 2017-03-23 NOTE — Anesthesia Procedure Notes (Signed)
Procedure Name: Intubation Date/Time: 03/23/2017 12:54 PM Performed by: Coralee Rud Pre-anesthesia Checklist: Patient identified, Emergency Drugs available, Suction available and Patient being monitored Patient Re-evaluated:Patient Re-evaluated prior to induction Oxygen Delivery Method: Circle system utilized Preoxygenation: Pre-oxygenation with 100% oxygen Induction Type: IV induction Ventilation: Mask ventilation without difficulty Laryngoscope Size: Miller and 3 Grade View: Grade I Tube type: Oral Tube size: 7.5 mm Number of attempts: 1 Airway Equipment and Method: Stylet Placement Confirmation: ETT inserted through vocal cords under direct vision,  positive ETCO2 and breath sounds checked- equal and bilateral Secured at: 22 cm Tube secured with: Tape Dental Injury: Teeth and Oropharynx as per pre-operative assessment

## 2017-03-23 NOTE — Anesthesia Preprocedure Evaluation (Addendum)
Anesthesia Evaluation  Patient identified by MRN, date of birth, ID band Patient awake    Reviewed: Allergy & Precautions, NPO status , Patient's Chart, lab work & pertinent test results  Airway Mallampati: I  TM Distance: >3 FB Neck ROM: Full    Dental no notable dental hx.    Pulmonary asthma (controlled) ,    Pulmonary exam normal breath sounds clear to auscultation       Cardiovascular negative cardio ROS Normal cardiovascular exam Rhythm:Regular Rate:Normal     Neuro/Psych PSYCHIATRIC DISORDERS Bipolar Disorder negative neurological ROS     GI/Hepatic negative GI ROS, Neg liver ROS,   Endo/Other  Hypothyroidism Morbid obesity  Renal/GU negative Renal ROS     Musculoskeletal negative musculoskeletal ROS (+)   Abdominal (+) + obese,   Peds  Hematology negative hematology ROS (+)   Anesthesia Other Findings   Reproductive/Obstetrics                            Anesthesia Physical Anesthesia Plan  ASA: III  Anesthesia Plan: General   Post-op Pain Management:    Induction: Intravenous  PONV Risk Score and Plan: 3 and Ondansetron, Dexamethasone, Midazolam and Treatment may vary due to age or medical condition  Airway Management Planned: Oral ETT  Additional Equipment:   Intra-op Plan:   Post-operative Plan: Extubation in OR  Informed Consent: I have reviewed the patients History and Physical, chart, labs and discussed the procedure including the risks, benefits and alternatives for the proposed anesthesia with the patient or authorized representative who has indicated his/her understanding and acceptance.   Dental advisory given  Plan Discussed with: CRNA  Anesthesia Plan Comments:         Anesthesia Quick Evaluation

## 2017-03-23 NOTE — Progress Notes (Signed)
Pt to OR via bed.  Parents with pt.

## 2017-03-24 ENCOUNTER — Encounter (HOSPITAL_COMMUNITY): Payer: Self-pay | Admitting: Surgery

## 2017-03-24 LAB — COMPREHENSIVE METABOLIC PANEL
ALK PHOS: 181 U/L — AB (ref 38–126)
ALT: 131 U/L — AB (ref 14–54)
AST: 122 U/L — AB (ref 15–41)
Albumin: 3.2 g/dL — ABNORMAL LOW (ref 3.5–5.0)
Anion gap: 7 (ref 5–15)
CHLORIDE: 109 mmol/L (ref 101–111)
CO2: 21 mmol/L — AB (ref 22–32)
CREATININE: 0.74 mg/dL (ref 0.44–1.00)
Calcium: 9 mg/dL (ref 8.9–10.3)
GFR calc Af Amer: >60 mL/min (ref >60)
GFR calc non Af Amer: >60 mL/min (ref >60)
GLUCOSE: 152 mg/dL — AB (ref 65–99)
Potassium: 4 mmol/L (ref 3.5–5.1)
SODIUM: 137 mmol/L (ref 135–145)
Total Bilirubin: 1.7 mg/dL — ABNORMAL HIGH (ref 0.3–1.2)
Total Protein: 6.4 g/dL — ABNORMAL LOW (ref 6.5–8.1)

## 2017-03-24 LAB — CBC
HCT: 38.9 % (ref 36.0–46.0)
HCT: 40.1 % (ref 36.0–46.0)
HEMOGLOBIN: 12.8 g/dL (ref 12.0–15.0)
Hemoglobin: 13.2 g/dL (ref 12.0–15.0)
MCH: 30.3 pg (ref 26.0–34.0)
MCH: 30.6 pg (ref 26.0–34.0)
MCHC: 32.9 g/dL (ref 30.0–36.0)
MCHC: 32.9 g/dL (ref 30.0–36.0)
MCV: 92 fL (ref 78.0–100.0)
MCV: 92.8 fL (ref 78.0–100.0)
PLATELETS: 269 10*3/uL (ref 150–400)
PLATELETS: 276 10*3/uL (ref 150–400)
RBC: 4.23 MIL/uL (ref 3.87–5.11)
RBC: 4.32 MIL/uL (ref 3.87–5.11)
RDW: 13.8 % (ref 11.5–15.5)
RDW: 13.4 % (ref 11.5–15.5)
WBC: 12.1 10*3/uL — AB (ref 4.0–10.5)
WBC: 14.1 10*3/uL — ABNORMAL HIGH (ref 4.0–10.5)

## 2017-03-24 LAB — CREATININE, SERUM
Creatinine, Ser: 0.77 mg/dL (ref 0.44–1.00)
GFR calc Af Amer: >60 mL/min (ref >60)
GFR calc non Af Amer: >60 mL/min (ref >60)

## 2017-03-24 LAB — HIV ANTIBODY (ROUTINE TESTING W REFLEX): HIV Screen 4th Generation wRfx: NONREACTIVE

## 2017-03-24 MED ORDER — GUAIFENESIN ER 600 MG PO TB12
600.0000 mg | ORAL_TABLET | Freq: Two times a day (BID) | ORAL | Status: DC
  Administered 2017-03-24 – 2017-03-26 (×5): 600 mg via ORAL
  Filled 2017-03-24 (×5): qty 1

## 2017-03-24 MED ORDER — LEVOTHYROXINE SODIUM 75 MCG PO TABS
150.0000 ug | ORAL_TABLET | Freq: Every day | ORAL | Status: DC
  Administered 2017-03-24 – 2017-03-26 (×3): 150 ug via ORAL
  Filled 2017-03-24 (×3): qty 2

## 2017-03-24 MED ORDER — METHOCARBAMOL 500 MG PO TABS
500.0000 mg | ORAL_TABLET | Freq: Four times a day (QID) | ORAL | Status: DC | PRN
  Administered 2017-03-24 – 2017-03-25 (×3): 500 mg via ORAL
  Filled 2017-03-24 (×3): qty 1

## 2017-03-24 MED ORDER — ENOXAPARIN SODIUM 40 MG/0.4ML ~~LOC~~ SOLN
40.0000 mg | SUBCUTANEOUS | Status: DC
  Administered 2017-03-24 – 2017-03-26 (×3): 40 mg via SUBCUTANEOUS
  Filled 2017-03-24 (×3): qty 0.4

## 2017-03-24 MED ORDER — PIPERACILLIN-TAZOBACTAM 3.375 G IVPB
3.3750 g | Freq: Three times a day (TID) | INTRAVENOUS | Status: DC
  Administered 2017-03-24 – 2017-03-26 (×6): 3.375 g via INTRAVENOUS
  Filled 2017-03-24 (×8): qty 50

## 2017-03-24 NOTE — Care Management Note (Signed)
Case Management Note  Patient Details  Name: Jenna Navarro MRN: [id number] Date of Birth: [birth year]  Subjective/Objective:                    Action/Plan:  Can provide MATCH letter on day of discharge Expected Discharge Date:  03/24/17               Expected Discharge Plan:  Home/Self Care  In-House Referral:     Discharge planning Services  CM Consult, MATCH Program, Medication Assistance, Indigent Health Clinic  Post Acute Care Choice:    Choice offered to:     DME Arranged:    DME Agency:     HH Arranged:    HH Agency:     Status of Service:  In process, will continue to follow  If discussed at Long Length of Stay Meetings, dates discussed:    Additional Comments:  Kingsley Plan, RN 03/24/2017, 11:19 AM

## 2017-03-24 NOTE — Progress Notes (Signed)
[facility] Progress Note  1 Day Post-Op  Subjective: CC: abdominal pain Patient getting up to chair. Complaining of soreness in abdomen and occasional sharp abdominal pain in Left abdomen. Feels distended, no flatus. Taking clears, denies nausea or vomiting. Concerned that she may not be on abx still.   Objective: Vital signs in last 24 hours: Temp:  [97.3 F (36.3 C)-98.9 F (37.2 C)] 98.4 F (36.9 C) (08/27 0631) Pulse Rate:  [80-94] 92 (08/27 0631) Resp:  [16-26] 17 (08/27 0631) BP: (126-148)/(67-96) 134/76 (08/27 0631) SpO2:  [92 %-100 %] 97 % (08/27 0631) Last BM Date: 03/21/17  Intake/Output from previous day: 08/26 0701 - 08/27 0700 In: 1992.5 [P.O.:200; I.V.:1792.5] Out: 1245 [Urine:1100; Drains:125; Blood:20] Intake/Output this shift: No intake/output data recorded.  PE: Gen:  Alert, NAD, pleasant Card:  Regular rate and rhythm Pulm:  Normal effort, clear to auscultation bilaterally Abd: Soft, appropriately tender, mildly distended, bowel sounds very hypoactive, no HSM, incisions C/D/I; drain in RUQ with serosanguinous output Skin: warm and dry, no rashes  Psych: A&Ox3   Lab Results:   Recent Labs  03/23/17 1008 03/24/17 0610  WBC 13.6* 12.1*  HGB 13.0 12.8  HCT 40.2 38.9  PLT 257 269   BMET  Recent Labs  03/23/17 1008 03/24/17 0610  NA 135 137  K 4.0 4.0  CL 108 109  CO2 19* 21*  GLUCOSE 134* 152*  BUN <5* <5*  CREATININE 0.73 0.74  CALCIUM 8.9 9.0   PT/INR No results for input(s): LABPROT, INR in the last 72 hours. CMP     Component Value Date/Time   NA 137 03/24/2017 0610   K 4.0 03/24/2017 0610   CL 109 03/24/2017 0610   CO2 21 (L) 03/24/2017 0610   GLUCOSE 152 (H) 03/24/2017 0610   BUN <5 (L) 03/24/2017 0610   CREATININE 0.74 03/24/2017 0610   CALCIUM 9.0 03/24/2017 0610   PROT 6.4 (L) 03/24/2017 0610   ALBUMIN 3.2 (L) 03/24/2017 0610   AST 122 (H) 03/24/2017 0610   ALT 131 (H) 03/24/2017 0610   ALKPHOS 181 (H)  03/24/2017 0610   BILITOT 1.7 (H) 03/24/2017 0610   GFRNONAA >60 03/24/2017 0610   GFRAA >60 03/24/2017 0610   Lipase     Component Value Date/Time   LIPASE 26 03/22/2017 1831       Studies/Results: US Abdomen Limited  Result Date: 03/22/2017 CLINICAL DATA:  Epigastric and right upper quadrant abdominal pain. Nausea and vomiting. EXAM: ULTRASOUND ABDOMEN LIMITED RIGHT UPPER QUADRANT COMPARISON:  None. FINDINGS: Gallbladder: Distended containing intraluminal stones and sludge. There is gallbladder wall thickening of 5 mm. Small amount pericholecystic fluid. A positive sonographic Eulah Pont sign was noted by sonographer. Common bile duct: Diameter: 5 mm, normal. Liver: Diffusely increased and heterogeneous in parenchymal echogenicity. Liver parenchyma is difficult to penetrate. No discrete focal lesion is seen. Portal vein is patent on color Doppler imaging with normal direction of blood flow towards the liver. IMPRESSION: 1. Acute cholecystitis. Cholelithiasis with gallbladder wall thickening, small volume pericholecystic fluid and positive sonographic Murphy sign. 2. Hepatic steatosis. Electronically Signed   By: Rubye Oaks M.D.   On: 03/22/2017 23:28    Anti-infectives: Anti-infectives    Start     Dose/Rate Route Frequency Ordered Stop   03/23/17 0800  piperacillin-tazobactam (ZOSYN) IVPB 3.375 g  Status:  Discontinued     3.375 g 12.5 mL/hr over 240 Minutes Intravenous Every 8 hours 03/23/17 0309 03/23/17 1600   03/23/17 0315  piperacillin-tazobactam (  ZOSYN) IVPB 3.375 g     3.375 g 100 mL/hr over 30 Minutes Intravenous  Once 03/23/17 0314 03/23/17 0428       Assessment/Plan Acute calculous cholecystitis S/P laparoscopic cholecystectomy 03/23/17 Dr. Luisa Hart - POD#1 - keep on clears, encourage ambulation - WBC 12.1, afebrile - restart IV zosyn, patient to be discharged on PO abx to complete 7d total - RUQ drain with 125 cc output serosanguinous - continue drain - Tbili  elevated to 1.7 - monitor drain output closely, will recheck tomorrow Asthma  Cough - will order mucinex  Morbid obesity Bipolar disorder Fatty liver  FEN - clears for now, maybe able to advance later today or tomorrow VTE - SCDs, ok to start lovenox ID - IV zosyn (8/26) restarted (8/27>>)  Plan: Continue drain and IV abx. Encourage ambulation. AM labs.   LOS: 1 day    Wells Guiles , [facility] 03/24/2017, 9:02 AM Pager: [phone] Consults: [phone] Mon-Fri 7:00 am-4:30 pm Sat-Sun 7:00 am-11:30 am

## 2017-03-25 LAB — CBC
HEMATOCRIT: 35.6 % — AB (ref 36.0–46.0)
HEMOGLOBIN: 11.4 g/dL — AB (ref 12.0–15.0)
MCH: 29.6 pg (ref 26.0–34.0)
MCHC: 32 g/dL (ref 30.0–36.0)
MCV: 92.5 fL (ref 78.0–100.0)
Platelets: 249 10*3/uL (ref 150–400)
RBC: 3.85 MIL/uL — ABNORMAL LOW (ref 3.87–5.11)
RDW: 13.5 % (ref 11.5–15.5)
WBC: 8.3 10*3/uL (ref 4.0–10.5)

## 2017-03-25 LAB — COMPREHENSIVE METABOLIC PANEL
ALBUMIN: 2.9 g/dL — AB (ref 3.5–5.0)
ALK PHOS: 168 U/L — AB (ref 38–126)
ALT: 123 U/L — ABNORMAL HIGH (ref 14–54)
ANION GAP: 7 (ref 5–15)
AST: 84 U/L — AB (ref 15–41)
CALCIUM: 8.8 mg/dL — AB (ref 8.9–10.3)
CO2: 24 mmol/L (ref 22–32)
Chloride: 105 mmol/L (ref 101–111)
Creatinine, Ser: 0.75 mg/dL (ref 0.44–1.00)
GFR calc Af Amer: >60 mL/min (ref >60)
GFR calc non Af Amer: >60 mL/min (ref >60)
GLUCOSE: 111 mg/dL — AB (ref 65–99)
POTASSIUM: 3.7 mmol/L (ref 3.5–5.1)
SODIUM: 136 mmol/L (ref 135–145)
Total Bilirubin: 1.3 mg/dL — ABNORMAL HIGH (ref 0.3–1.2)
Total Protein: 6 g/dL — ABNORMAL LOW (ref 6.5–8.1)

## 2017-03-25 MED ORDER — POLYETHYLENE GLYCOL 3350 17 G PO PACK
17.0000 g | PACK | Freq: Every day | ORAL | Status: DC | PRN
  Administered 2017-03-25: 17 g via ORAL

## 2017-03-25 NOTE — Progress Notes (Signed)
[facility] Progress Note  2 Days Post-Op  Subjective: CC: abdominal pain  Patient's abdominal pain improving. Tolerating fulls, denies nausea or vomiting. Passing flatus. UOP good. VSS.   Objective: Vital signs in last 24 hours: Temp:  [98.1 F (36.7 C)-98.8 F (37.1 C)] 98.1 F (36.7 C) (08/28 0542) Pulse Rate:  [87-91] 87 (08/28 0542) Resp:  [18-19] 19 (08/28 0542) BP: (131-150)/(77-82) 150/80 (08/28 0542) SpO2:  [99 %] 99 % (08/28 0542) Last BM Date: 03/21/17  Intake/Output from previous day: 08/27 0701 - 08/28 0700 In: 1664.2 [P.O.:360; I.V.:1254.2; IV Piggyback:50] Out: 2000 [Urine:1900; Drains:100] Intake/Output this shift: No intake/output data recorded.  PE: Gen:  Alert, NAD, pleasant Card:  Regular rate and rhythm Pulm:  Normal effort, clear to auscultation bilaterally Abd: Soft, non-tender, mildly distended, bowel sounds hypoactive, no HSM, incisions C/D/I; drain in RUG with bile tinged serous fluid Skin: warm and dry, no rashes  Psych: A&Ox3   Lab Results:   Recent Labs  03/24/17 1148 03/25/17 0441  WBC 14.1* 8.3  HGB 13.2 11.4*  HCT 40.1 35.6*  PLT 276 249   BMET  Recent Labs  03/24/17 0610 03/24/17 1148 03/25/17 0441  NA 137  --  136  K 4.0  --  3.7  CL 109  --  105  CO2 21*  --  24  GLUCOSE 152*  --  111*  BUN <5*  --  <5*  CREATININE 0.74 0.77 0.75  CALCIUM 9.0  --  8.8*   PT/INR No results for input(s): LABPROT, INR in the last 72 hours. CMP     Component Value Date/Time   NA 136 03/25/2017 0441   K 3.7 03/25/2017 0441   CL 105 03/25/2017 0441   CO2 24 03/25/2017 0441   GLUCOSE 111 (H) 03/25/2017 0441   BUN <5 (L) 03/25/2017 0441   CREATININE 0.75 03/25/2017 0441   CALCIUM 8.8 (L) 03/25/2017 0441   PROT 6.0 (L) 03/25/2017 0441   ALBUMIN 2.9 (L) 03/25/2017 0441   AST 84 (H) 03/25/2017 0441   ALT 123 (H) 03/25/2017 0441   ALKPHOS 168 (H) 03/25/2017 0441   BILITOT 1.3 (H) 03/25/2017 0441   GFRNONAA >60  03/25/2017 0441   GFRAA >60 03/25/2017 0441   Lipase     Component Value Date/Time   LIPASE 26 03/22/2017 1831       Studies/Results: No results found.  Anti-infectives: Anti-infectives    Start     Dose/Rate Route Frequency Ordered Stop   03/24/17 1000  piperacillin-tazobactam (ZOSYN) IVPB 3.375 g     3.375 g 12.5 mL/hr over 240 Minutes Intravenous Every 8 hours 03/24/17 0936     03/23/17 0800  piperacillin-tazobactam (ZOSYN) IVPB 3.375 g  Status:  Discontinued     3.375 g 12.5 mL/hr over 240 Minutes Intravenous Every 8 hours 03/23/17 0309 03/23/17 1600   03/23/17 0315  piperacillin-tazobactam (ZOSYN) IVPB 3.375 g     3.375 g 100 mL/hr over 30 Minutes Intravenous  Once 03/23/17 0314 03/23/17 0428       Assessment/Plan Acute calculous cholecystitis S/P laparoscopic cholecystectomy 03/23/17 Dr. Luisa Hart - POD#2 - advance to regular diet, encourage ambulation - WBC 8.3, afebrile - continue IV zosyn, patient to be discharged on PO abx to complete 7d total - RUQ drain with 100 cc output bilious - continue drain - Tbili 1.3, trending down - monitor drain output closely, will recheck tomorrow Asthma  Cough - will order mucinex  Morbid obesity Bipolar disorder Fatty liver  FEN - reg VTE - SCDs, lovenox ID - IV zosyn (8/26) restarted (8/27>>)  Plan: Continue drain and IV abx. Encourage ambulation. Advance to regular diet. May be ready for discharge home tomorrow.   LOS: 2 days    Wells Guiles , [facility] 03/25/2017, 9:47 AM Pager: [phone] Consults: [phone] Mon-Fri 7:00 am-4:30 pm Sat-Sun 7:00 am-11:30 am

## 2017-03-26 MED ORDER — AMOXICILLIN-POT CLAVULANATE 875-125 MG PO TABS: 1.0000 | ORAL_TABLET | Freq: Two times a day (BID) | ORAL | 0 refills | Status: AC

## 2017-03-26 MED ORDER — METHOCARBAMOL 500 MG PO TABS: 500.0000 mg | ORAL_TABLET | Freq: Four times a day (QID) | ORAL | 0 refills | Status: DC | PRN

## 2017-03-26 MED ORDER — OXYCODONE HCL 5 MG PO TABS: 5.0000 mg | ORAL_TABLET | ORAL | 0 refills | Status: DC | PRN

## 2017-03-26 NOTE — Discharge Instructions (Signed)
please arrive at least 30 min before your appointment to complete your check in paperwork.  If you are unable to arrive 30 min prior to your appointment time we may have to cancel or reschedule you.  LAPAROSCOPIC SURGERY: POST OP INSTRUCTIONS  1. DIET: Follow a light bland diet the first 24 hours after arrival home, such as soup, liquids, crackers, etc. Be sure to include lots of fluids daily. Avoid fast food or heavy meals as your are more likely to get nauseated. Eat a low fat the next few days after surgery.  2. Take your usually prescribed home medications unless otherwise directed. 3. PAIN CONTROL:  1. Pain is best controlled by a usual combination of three different methods TOGETHER:  1. Ice/Heat 2. Over the counter pain medication 3. Prescription pain medication 2. Most patients will experience some swelling and bruising around the incisions. Ice packs or heating pads (30-60 minutes up to 6 times a day) will help. Use ice for the first few days to help decrease swelling and bruising, then switch to heat to help relax tight/sore spots and speed recovery. Some people prefer to use ice alone, heat alone, alternating between ice & heat. Experiment to what works for you. Swelling and bruising can take several Baray to resolve.  3. It is helpful to take an over-the-counter pain medication regularly for the first few [place]. Choose one of the following that works best for you:  1. Naproxen (Aleve, etc) Two 220mg  tabs twice a day 2. Ibuprofen (Advil, etc) Three 200mg  tabs four times a day (every meal & bedtime) 3. Acetaminophen (Tylenol, etc) 500-650mg  four times a day (every meal & bedtime) 4. A prescription for pain medication (such as oxycodone, hydrocodone, etc) should be given to you upon discharge. Take your pain medication as prescribed.  1. If you are having problems/concerns with the prescription medicine (does not control pain, nausea, vomiting, rash, itching, etc), please call us [phone]  [phone] to see if we need to switch you to a different pain medicine that will work better for you and/or control your side effect better. 2. If you need a refill on your pain medication, please contact your pharmacy. They will contact our office to request authorization. Prescriptions will not be filled after 5 pm or on week-ends. 4. Avoid getting constipated. Between the surgery and the pain medications, it is common to experience some constipation. Increasing fluid intake and taking a fiber supplement (such as Metamucil, Citrucel, FiberCon, MiraLax, etc) 1-2 times a day regularly will usually help prevent this problem from occurring. A mild laxative (prune juice, Milk of Magnesia, MiraLax, etc) should be taken according to package directions if there are no bowel movements after 48 hours.  5. Watch out for diarrhea. If you have many loose bowel movements, simplify your diet to bland foods & liquids for a few days. Stop any stool softeners and decrease your fiber supplement. Switching to mild anti-diarrheal medications (Kayopectate, Pepto Bismol) can help. If this worsens or does not improve, please call us. 6. Wash / shower every day. You may shower over the dressings as they are waterproof. Continue to shower over incision(s) after the dressing is off. 7. Remove your waterproof bandages 5 days after surgery. You may leave the incision open to air. You may replace a dressing/Band-Aid to cover the incision for comfort if you wish.  8. ACTIVITIES as tolerated:  1. You may resume regular (light) daily activities beginning the next day--such as daily self-care, walking, climbing stairs--gradually  increasing activities as tolerated. If you can walk 30 minutes without difficulty, it is safe to try more intense activity such as jogging, treadmill, bicycling, low-impact aerobics, swimming, etc. 2. Save the most intensive and strenuous activity for last such as sit-ups, heavy lifting, contact sports, etc Refrain  from any heavy lifting or straining until you are off narcotics for pain control.  3. DO NOT PUSH THROUGH PAIN. Let pain be your guide: If it hurts to do something, don't do it. Pain is your body warning you to avoid that activity for another week until the pain goes down. 4. You may drive when you are no longer taking prescription pain medication, you can comfortably wear a seatbelt, and you can safely maneuver your car and apply brakes. 5. You may have sexual intercourse when it is comfortable.  9. FOLLOW UP in our office  1. Please call [facility] at [phone] to set up an appointment to see your surgeon in the office for a follow-up appointment approximately 2-3 weeks after your surgery. 2. Make sure that you call for this appointment the day you arrive home to insure a convenient appointment time.      10. IF YOU HAVE DISABILITY OR FAMILY LEAVE FORMS, BRING THEM TO THE               OFFICE FOR PROCESSING.   WHEN TO CALL US [phone]:  1. Poor pain control 2. Reactions / problems with new medications (rash/itching, nausea, etc)  3. Fever over 101.5 F (38.5 C) 4. Inability to urinate 5. Nausea and/or vomiting 6. Worsening swelling or bruising 7. Continued bleeding from incision. 8. Increased pain, redness, or drainage from the incision  The clinic staff is available to answer your questions during regular business hours (8:30am-5pm). Please dont hesitate to call and ask to speak to one of our nurses for clinical concerns.  If you have a medical emergency, go to the nearest emergency room or call 911.  [facility] from [facility] is always on call at the [facility]   [facility] [place]  [address], [address], [place], [place] [address] ?  MAIN: [phone] ? TOLL FREE: [phone] ?  FAX [phone]  [email or web address]      [employer] Totals - you can use this to record drain output  Bring this sheet to all of your post-operative  appointments while you have your drains.  Please measure your drains by CC's or ML's.  Make sure you drain and measure your JP Drains 3 times per day.  At the end of each day, add up totals for the left side and add up totals for the right side.    ( 9 am )     ( 3 pm )        ( 9 pm )                Date L  R  L  R  L  R  Total L/R

## 2017-03-26 NOTE — Discharge Summary (Signed)
[facility] Discharge Summary   Patient ID: Jenna Navarro MRN: [id number] DOB/AGE: 1975-09-12 41 y.o.  Admit date: 03/22/2017 Discharge date: 03/26/2017  Admitting Diagnosis: Acute cholecystitis  Discharge Diagnosis Patient Active Problem List   Diagnosis Date Noted  . Cholecystitis 03/23/2017   Consultants None  Imaging: RUQ [place]: Acute cholecystitis. Cholelithiasis with gallbladder wall thickening, small volume pericholecystic fluid and positive sonographic Murphy sign  Procedures Dr. Luisa Hart (03/23/17) - Laparoscopic Cholecystectomy    Hospital Course:  Patient is a 41 y/o female who presented to [facility] with RUQ pain.  Workup showed Acute cholecystitis.  Patient was admitted and underwent procedure listed above.  Tolerated procedure well and was transferred to the floor.  Diet was advanced as tolerated.  On POD#3, the patient was voiding well, tolerating diet, ambulating well, pain well controlled, vital signs stable, incisions c/d/i and felt stable for discharge home. Patient is being discharged with a JP drain in the RUQ. Patient will follow up in our office in 1 week and knows to call with questions or concerns. She will call to confirm appointment date/time.    Physical Exam: General:  Alert, NAD, pleasant, comfortable Abd:  Soft, ND, mild tenderness, incisions C/D/I, drain with minimal  Bile tinged sanguinous drainage   Allergies as of 03/26/2017      Reactions   Codeine Shortness Of Breath   Rapid heart rate, sweating      Medication List    TAKE these medications   ADVAIR DISKUS 250-50 MCG/DOSE Aepb Generic drug:  Fluticasone-Salmeterol Inhale 1 puff into the lungs 2 (two) times daily.   albuterol 108 (90 Base) MCG/ACT inhaler Commonly known as:  PROVENTIL HFA;VENTOLIN HFA Inhale 1-2 puffs into the lungs every 6 (six) hours as needed for wheezing or shortness of breath.   amoxicillin-clavulanate 875-125 MG tablet Commonly known as:   AUGMENTIN Take 1 tablet by mouth every 12 (twelve) hours.   ARIPiprazole 10 MG tablet Commonly known as:  ABILIFY Take 10 mg by mouth at bedtime.   Armodafinil 250 MG tablet Take 250 mg by mouth daily.   lamoTRIgine 100 MG tablet Commonly known as:  LAMICTAL Take 300 mg by mouth at bedtime.   levothyroxine 150 MCG tablet Commonly known as:  SYNTHROID, LEVOTHROID Take 150 mcg by mouth daily.   lithium carbonate 450 MG CR tablet Commonly known as:  ESKALITH Take 1,350 mg by mouth at bedtime.   methocarbamol 500 MG tablet Commonly known as:  ROBAXIN Take 1 tablet (500 mg total) by mouth every 6 (six) hours as needed for muscle spasms.   minocycline 100 MG tablet Commonly known as:  DYNACIN Take 100 mg by mouth 2 (two) times daily.   montelukast 10 MG tablet Commonly known as:  SINGULAIR Take 10 mg by mouth daily.   NORTREL 1/35 (28) tablet Generic drug:  norethindrone-ethinyl estradiol 1/35 Take 1 tablet by mouth at bedtime.   oxyCODONE 5 MG immediate release tablet Commonly known as:  Oxy IR/ROXICODONE Take 1 tablet (5 mg total) by mouth every 4 (four) hours as needed for moderate pain or severe pain.   risperiDONE 0.25 MG tablet Commonly known as:  RISPERDAL Take 0.75 mg by mouth at bedtime.            Discharge Care Instructions        Start     Ordered   03/26/17 0000  methocarbamol (ROBAXIN) 500 MG tablet  Every 6 hours PRN    Question:  Supervising Provider  Answer:  Jimmye Norman   03/26/17 1216   03/26/17 0000  oxyCODONE (OXY IR/ROXICODONE) 5 MG immediate release tablet  Every 4 hours PRN    Question:  Supervising Provider  Answer:  Jimmye Norman   03/26/17 1216   03/26/17 0000  amoxicillin-clavulanate (AUGMENTIN) 875-125 MG tablet  Every 12 hours    Question:  Supervising Provider  Answer:  Jimmye Norman   03/26/17 1216       Follow-up Information    [facility]. Schedule an appointment as soon as possible for a  visit.   Contact information: [address] [place] [address] [phone]       Harriette Bouillon, MD. Call.   Specialty:  General Surgery Why:  Call when you leave the hospital to make a follow up appointment for 1 week Contact information: [address] [place] [address] [phone]        Jimmye Norman, MD. Call.   Specialty:  General Surgery Why:  Call and make an appointment to follow up with either Dr. Lindie Spruce or Dr. Luisa Hart in 1 week for drain removal and post-op care.  Contact information: [address] [place] [address] [phone]           Signed: Wells Guiles , [facility] 03/26/2017, 1:36 PM Pager: [phone] Consults: [phone] Mon-Fri 7:00 am-4:30 pm Sat-Sun 7:00 am-11:30 am

## 2017-03-26 NOTE — Progress Notes (Signed)
Discharge paperwork reviewed with patient. No questions verbalized. Prescriptions given. Patient is ready for discharge.

## 2018-04-24 ENCOUNTER — Emergency Department (HOSPITAL_COMMUNITY): Payer: BLUE CROSS/BLUE SHIELD

## 2018-04-24 ENCOUNTER — Other Ambulatory Visit: Payer: Self-pay

## 2018-04-24 ENCOUNTER — Encounter (HOSPITAL_COMMUNITY): Payer: Self-pay | Admitting: Emergency Medicine

## 2018-04-24 ENCOUNTER — Emergency Department (HOSPITAL_COMMUNITY)
Admission: EM | Admit: 2018-04-24 | Discharge: 2018-04-24 | Disposition: A | Discharge status: Home / Self Care | Payer: BLUE CROSS/BLUE SHIELD | Attending: Emergency Medicine | Admitting: Emergency Medicine

## 2018-04-24 DIAGNOSIS — J45909 Unspecified asthma, uncomplicated: Secondary | ICD-10-CM | POA: Insufficient documentation

## 2018-04-24 DIAGNOSIS — R112 Nausea with vomiting, unspecified: Secondary | ICD-10-CM | POA: Diagnosis not present

## 2018-04-24 DIAGNOSIS — R1011 Right upper quadrant pain: Secondary | ICD-10-CM | POA: Diagnosis present

## 2018-04-24 DIAGNOSIS — R945 Abnormal results of liver function studies: Secondary | ICD-10-CM | POA: Insufficient documentation

## 2018-04-24 DIAGNOSIS — E063 Autoimmune thyroiditis: Secondary | ICD-10-CM | POA: Diagnosis not present

## 2018-04-24 DIAGNOSIS — R7989 Other specified abnormal findings of blood chemistry: Secondary | ICD-10-CM

## 2018-04-24 DIAGNOSIS — Z79899 Other long term (current) drug therapy: Secondary | ICD-10-CM | POA: Diagnosis not present

## 2018-04-24 LAB — COMPREHENSIVE METABOLIC PANEL
ALK PHOS: 230 U/L — AB (ref 38–126)
ALT: 874 U/L — ABNORMAL HIGH (ref 0–44)
ANION GAP: 10 (ref 5–15)
AST: 608 U/L — ABNORMAL HIGH (ref 15–41)
Albumin: 4 g/dL (ref 3.5–5.0)
BILIRUBIN TOTAL: 1.4 mg/dL — AB (ref 0.3–1.2)
BUN: 7 mg/dL (ref 6–20)
CALCIUM: 9.9 mg/dL (ref 8.9–10.3)
CO2: 22 mmol/L (ref 22–32)
Chloride: 106 mmol/L (ref 98–111)
Creatinine, Ser: 0.88 mg/dL (ref 0.44–1.00)
GFR calc non Af Amer: >60 mL/min (ref >60)
Glucose, Bld: 103 mg/dL — ABNORMAL HIGH (ref 70–99)
Potassium: 3.9 mmol/L (ref 3.5–5.1)
Sodium: 138 mmol/L (ref 135–145)
TOTAL PROTEIN: 6.7 g/dL (ref 6.5–8.1)

## 2018-04-24 LAB — LIPASE, BLOOD: Lipase: 30 U/L (ref 11–51)

## 2018-04-24 LAB — URINALYSIS, ROUTINE W REFLEX MICROSCOPIC
Bacteria, UA: NONE SEEN
Bilirubin Urine: NEGATIVE
GLUCOSE, UA: NEGATIVE mg/dL
Ketones, ur: NEGATIVE mg/dL
LEUKOCYTES UA: NEGATIVE
NITRITE: NEGATIVE
PROTEIN: NEGATIVE mg/dL
Specific Gravity, Urine: 1.023 (ref 1.005–1.030)
pH: 7 (ref 5.0–8.0)

## 2018-04-24 LAB — CBC
HCT: 41.9 % (ref 36.0–46.0)
HEMOGLOBIN: 13.8 g/dL (ref 12.0–15.0)
MCH: 31.4 pg (ref 26.0–34.0)
MCHC: 32.9 g/dL (ref 30.0–36.0)
MCV: 95.4 fL (ref 78.0–100.0)
Platelets: 287 10*3/uL (ref 150–400)
RBC: 4.39 MIL/uL (ref 3.87–5.11)
RDW: 12.6 % (ref 11.5–15.5)
WBC: 5.3 10*3/uL (ref 4.0–10.5)

## 2018-04-24 LAB — I-STAT BETA HCG BLOOD, ED (MC, WL, AP ONLY): I-stat hCG, quantitative: <5 m[IU]/mL (ref <5)

## 2018-04-24 LAB — ACETAMINOPHEN LEVEL

## 2018-04-24 MED ORDER — ONDANSETRON 4 MG PO TBDP
4.0000 mg | ORAL_TABLET | Freq: Once | ORAL | Status: AC
  Administered 2018-04-24: 4 mg via ORAL
  Filled 2018-04-24: qty 1

## 2018-04-24 MED ORDER — SODIUM CHLORIDE 0.9 % IV BOLUS
1000.0000 mL | Freq: Once | INTRAVENOUS | Status: AC
  Administered 2018-04-24: 1000 mL via INTRAVENOUS

## 2018-04-24 MED ORDER — ONDANSETRON 4 MG PO TBDP: 4.0000 mg | ORAL_TABLET | Freq: Three times a day (TID) | ORAL | 0 refills | Status: DC | PRN

## 2018-04-24 MED ORDER — IOHEXOL 300 MG/ML  SOLN
100.0000 mL | Freq: Once | INTRAMUSCULAR | Status: AC | PRN
  Administered 2018-04-24: 100 mL via INTRAVENOUS

## 2018-04-24 NOTE — ED Notes (Signed)
Signature pad unavailable at time of pt discharge. Pt verbalized understanding of d/c instructions.Pt denied any further requests.  

## 2018-04-24 NOTE — ED Notes (Signed)
Pt given fluids.  

## 2018-04-24 NOTE — ED Triage Notes (Signed)
Pt to ER for evaluation of epigastric intermittent "tightness" with nausea and vomiting x2 days. No bowel movement in 2 days, reports last one was "difficult." pt in NAD at this time. VSS.

## 2018-04-24 NOTE — ED Notes (Signed)
ED Provider at bedside. 

## 2018-04-24 NOTE — ED Notes (Signed)
Patient transported to CT 

## 2018-04-24 NOTE — Discharge Instructions (Addendum)
You were evaluated today for abdominal pain.   Your liver test were elevated.  You will need to follow-up with GI for this.  Please schedule a follow-up appointment with the GI specialists listed on your discharge paper work. Your Hepatitis panel is still pending at discharge.  Your ultrasound and CT scan were negative while in the ED. You were able to tolerate oral intake while here. Please return to the ED with any new or worsening symptoms such as:  Get help right away if: Your pain does not go away as soon as your doctor says it should. You cannot stop throwing up. Your pain is only in areas of your belly, such as the right side or the left lower part of the belly. You have bloody or black poop, or poop that looks like tar. You have very bad pain, cramping, or bloating in your belly. You have signs of not having enough fluid or water in your body (dehydration), such as: Dark pee, very little pee, or no pee. Cracked lips. Dry mouth. Sunken eyes. Sleepiness. Weakness.

## 2018-04-24 NOTE — ED Provider Notes (Signed)
[facility] EMERGENCY DEPARTMENT Provider Note   CSN: [id number] Arrival date & time: 04/24/18  1236     History   Chief Complaint Chief Complaint  Patient presents with  . Abdominal Pain    HPI Jenna Navarro is a 42 y.o. female past medical history significant for bipolar and asthma who presents for evaluation of right upper quadrant pain x2 days.  Patient states she has had sudden onset, constant epigastric and right upper quadrant pain.  Has had multiple episodes of vomiting.  Denies blood in her emesis.  States she feels persistently nauseous.  Admits to constipation.  Has not had a bowel movement and 2 days.  States she normally goes every day. She rates her pain a 8/10.  Pain does not radiate.  Pain is worse with oral intake.  Denies alleviating factors. Denies fever, chills, pain, shortness of breath, diarrhea.  Admits to a lap chole 1 year ago.  HPI  Past Medical History:  Diagnosis Date  . Asthma   . Bipolar 1 disorder ([place])   . Thyroid disease    hashimoto     Patient Active Problem List   Diagnosis Date Noted  . Cholecystitis 03/23/2017    Past Surgical History:  Procedure Laterality Date  . CHOLECYSTECTOMY N/A 03/23/2017   Procedure: LAPAROSCOPIC CHOLECYSTECTOMY, W/ POSS IOC;  Surgeon: Erroll Luna, MD;  Location: [facility];  Service: General;  Laterality: N/A;     OB History   None      Home Medications    Prior to Admission medications   Medication Sig Start Date End Date Taking? Authorizing Provider  ADVAIR DISKUS 250-50 MCG/DOSE AEPB Inhale 1 puff into the lungs 2 (two) times daily. 03/07/17   [provider]  albuterol (PROVENTIL HFA;VENTOLIN HFA) 108 (90 Base) MCG/ACT inhaler Inhale 1-2 puffs into the lungs every 6 (six) hours as needed for wheezing or shortness of breath.    [provider]  ARIPiprazole (ABILIFY) 10 MG tablet Take 10 mg by mouth at bedtime. 02/05/17   [provider]  Armodafinil 250  MG tablet Take 250 mg by mouth daily. 03/16/17   [provider]  lamoTRIgine (LAMICTAL) 100 MG tablet Take 300 mg by mouth at bedtime. 02/05/17   [provider]  levothyroxine (SYNTHROID, LEVOTHROID) 150 MCG tablet Take 150 mcg by mouth daily. 03/10/17   [provider]  lithium carbonate (ESKALITH) 450 MG CR tablet Take 1,350 mg by mouth at bedtime. 03/07/17   [provider]  methocarbamol (ROBAXIN) 500 MG tablet Take 1 tablet (500 mg total) by mouth every 6 (six) hours as needed for muscle spasms. 03/26/17   Rayburn, Floyce Stakes, PA-C  minocycline (DYNACIN) 100 MG tablet Take 100 mg by mouth 2 (two) times daily.    [provider]  montelukast (SINGULAIR) 10 MG tablet Take 10 mg by mouth daily. 01/17/17   [provider]  NORTREL 1/35, 28, tablet Take 1 tablet by mouth at bedtime. 03/07/17   [provider]  ondansetron (ZOFRAN ODT) 4 MG disintegrating tablet Take 1 tablet (4 mg total) by mouth every 8 (eight) hours as needed for nausea or vomiting. 04/24/18   Maxamus Colao A, PA-C  oxyCODONE (OXY IR/ROXICODONE) 5 MG immediate release tablet Take 1 tablet (5 mg total) by mouth every 4 (four) hours as needed for moderate pain or severe pain. 03/26/17   Rayburn, Floyce Stakes, PA-C  risperiDONE (RISPERDAL) 0.25 MG tablet Take 0.75 mg by mouth at  bedtime. 02/05/17   [provider]    Family History History reviewed. No pertinent family history.  Social History Social History   Tobacco Use  . Smoking status: Never Smoker  . Smokeless tobacco: Never Used  Substance Use Topics  . Alcohol use: No  . Drug use: No     Allergies   Codeine   Review of Systems Review of Systems  Constitutional: Negative for activity change, appetite change, chills, diaphoresis, fatigue and fever.  Respiratory: Negative.   Cardiovascular: Negative.   Gastrointestinal: Positive for abdominal pain, constipation, nausea and vomiting. Negative for  abdominal distention, blood in stool, diarrhea and rectal pain.  Genitourinary: Negative for dysuria, flank pain, frequency and urgency.  Musculoskeletal: Negative for back pain.  Skin: Negative.      Physical Exam Updated Vital Signs BP 121/64   Pulse 76   Temp 99.6 F (37.6 C) (Oral)   Resp 18   LMP 04/24/2018   SpO2 100%   Physical Exam  Constitutional: She appears well-developed and well-nourished. No distress.  HENT:  Head: Atraumatic.  Mouth/Throat: Oropharynx is clear and moist.  Eyes: Pupils are equal, round, and reactive to light.  Neck: Normal range of motion.  Cardiovascular: Normal rate, regular rhythm and intact distal pulses. Exam reveals no friction rub.  Pulmonary/Chest: Effort normal and breath sounds normal. No stridor. No respiratory distress. She has no wheezes. She has no rhonchi. She has no rales. She exhibits no tenderness.  Abdominal: Soft. Normal appearance and bowel sounds are normal. She exhibits no shifting dullness, no distension, no pulsatile liver, no fluid wave, no abdominal bruit, no ascites, no pulsatile midline mass and no mass. There is no hepatosplenomegaly. There is tenderness in the right upper quadrant and epigastric area. There is no rigidity, no rebound, no guarding, no CVA tenderness and no tenderness at McBurney's point.  Musculoskeletal: Normal range of motion.  Neurological: She is alert.  Skin: Skin is warm and dry.  Psychiatric: She has a normal mood and affect.  Nursing note and vitals reviewed.    ED Treatments / Results  Labs (all labs ordered are listed, but only abnormal results are displayed) Labs Reviewed  COMPREHENSIVE METABOLIC PANEL - Abnormal; Notable for the following components:      Result Value   Glucose, Bld 103 (*)    AST 608 (*)    ALT 874 (*)    Alkaline Phosphatase 230 (*)    Total Bilirubin 1.4 (*)    All other components within normal limits  LIPASE, BLOOD  CBC  URINALYSIS, ROUTINE W REFLEX  MICROSCOPIC  ACETAMINOPHEN LEVEL  HEPATITIS PANEL, ACUTE  I-STAT BETA HCG BLOOD, ED ([facility], [facility], [facility] ONLY)    EKG EKG Interpretation  Date/Time:  Friday April 24 2018 12:43:44 EDT Ventricular Rate:  72 PR Interval:  144 QRS Duration: 86 QT Interval:  396 QTC Calculation: 433 R Axis:   -8 Text Interpretation:  Normal sinus rhythm Minimal voltage criteria for LVH, may be normal variant Borderline ECG No previous ECGs available Confirmed by Gareth Morgan [id number]) on 04/24/2018 7:42:27 PM   Radiology Ct Abdomen Pelvis W Contrast  Result Date: 04/24/2018 CLINICAL DATA:  Epigastric tightness with nausea vomiting for 2 days. EXAM: CT ABDOMEN AND PELVIS WITH CONTRAST TECHNIQUE: Multidetector CT imaging of the abdomen and pelvis was performed using the standard protocol following bolus administration of intravenous contrast. CONTRAST:  129m OMNIPAQUE IOHEXOL 300 MG/ML  SOLN COMPARISON:  None. FINDINGS: Lower chest: No acute abnormality. Hepatobiliary:  Fatty steatosis. Previous cholecystectomy. No liver mass. Portal vein is patent. Pancreas: Unremarkable. No pancreatic ductal dilatation or surrounding inflammatory changes. Spleen: Normal in size without focal abnormality. Adrenals/Urinary Tract: There is a small staghorn calculus in the lower pole of the right kidney measuring 13 mm. No other renal stones. No suspicious renal masses. No hydronephrosis or perinephric stranding. No ureterectasis or ureteral stones. The adrenal glands are normal. The bladder is unremarkable. Stomach/Bowel: The stomach and small bowel are normal. The colon and appendix are normal. Vascular/Lymphatic: No significant vascular findings are present. No enlarged abdominal or pelvic lymph nodes. Reproductive: Probable small fibroid in the anterior uterus is seen on axial image 73. The left ovary is normal. There appears to be a dominant follicle in the right ovary measuring up to 2.3 cm. Other: No free air free fluid.  Musculoskeletal: No acute or significant osseous findings. IMPRESSION: 1. No acute abnormality identified. 2. Small staghorn calculus measuring 13 mm in the lower pole the right kidney. No renal obstruction. 3. Probable small fibroid in the uterus. 4. Dominant follicle in the right ovary measuring 2.3 cm. 5. No other acute abnormalities. Electronically Signed   By: Dorise Bullion III M.D   On: 04/24/2018 20:24   US Abdomen Limited  Result Date: 04/24/2018 CLINICAL DATA:  Right upper quadrant abdominal pain. EXAM: ULTRASOUND ABDOMEN LIMITED RIGHT UPPER QUADRANT COMPARISON:  Radiographs of March 22, 2017. FINDINGS: Gallbladder: Status post cholecystectomy. Common bile duct: Diameter: 7.3 mm which is within normal limits given post cholecystectomy status Liver: No focal lesion identified. Within normal limits in parenchymal echogenicity. Portal vein is patent on color Doppler imaging with normal direction of blood flow towards the liver. IMPRESSION: Status post cholecystectomy. No other abnormality seen in the right upper quadrant of the abdomen. Electronically Signed   By: Marijo Conception, M.D.   On: 04/24/2018 18:56    Procedures Procedures (including critical care time)  Medications Ordered in ED Medications  sodium chloride 0.9 % bolus 1,000 mL (0 mLs Intravenous Stopped 04/24/18 2037)  iohexol (OMNIPAQUE) 300 MG/ML solution 100 mL (100 mLs Intravenous Contrast Given 04/24/18 1958)  ondansetron (ZOFRAN-ODT) disintegrating tablet 4 mg (4 mg Oral Given 04/24/18 2049)     Initial Impression / Assessment and Plan / ED Course  I have reviewed the triage vital signs and the nursing notes as the past medical record.  Pertinent labs & imaging results that were available during my care of the patient were reviewed by me and considered in my medical decision making (see chart for details).  42 year old female otherwise healthy presents for evaluation of right upper quadrant epigastric pain x2 days and  emesis.  History of lap chole 2018.  CBC without leukocytosis.  Lipase 30.  AST 608, ALT 874, alk phos 230, total bili 1.4.  Denies Tylenol use.  Will obtain ultrasound right upper quadrant to r/o duct stone.  Does not want anything for nausea or pain at this time.   [place] negative for duct dilation. Will obtain Tylenol level and CT and consult GI for recommendations. CT small staghorn calculus in right kidney, nonobstructive.  Urine with moderate hemoglobin, patient on current cycle.  2040: Consulted with Dr.Karki, with [facility].  She feels that patient's elevated liver enzymes are most likely due to a viral hepatitis.  Does not feel that patient's symptoms are due to an obstructive nature given ultrasound and CT scan.  Recommends p.o. trial of fluids and she is able to tolerate she may DC  home with follow-up with GI for recheck of labs next week.  We will give Zofran and p.o. trial and reassess. Patient is nontoxic, nonseptic appearing, in no apparent distress. Patient does not meet the SIRS or Sepsis criteria. On repeat exam patient does not have a surgical abdomin and there are no peritoneal signs.  No indication of appendicitis, bowel obstruction, bowel perforation, cholecystitis, diverticulitis, PID or ectopic pregnancy.    On reevalaution patient is able to tolerate p.o. Intake without difficulty.  And is without pain.  Discussed with patient need for follow-up with GI next week.  Discussed strict return precautions with patient.  Patient voiced understanding is agreeable for DC home.     Final Clinical Impressions(s) / ED Diagnoses   Final diagnoses:  Right upper quadrant abdominal pain  Elevated LFTs  Non-intractable vomiting with nausea, unspecified vomiting type    ED Discharge Orders         Ordered    ondansetron (ZOFRAN ODT) 4 MG disintegrating tablet  Every 8 hours PRN     04/24/18 2118           Demarrion Meiklejohn A, PA-C 04/24/18 2129    Gareth Morgan, MD 04/30/18  [phone]

## 2018-04-26 LAB — HEPATITIS PANEL, ACUTE
HEP A IGM: NEGATIVE
Hep B C IgM: NEGATIVE
Hepatitis B Surface Ag: NEGATIVE

## 2018-04-28 ENCOUNTER — Other Ambulatory Visit: Payer: Self-pay | Admitting: Gastroenterology

## 2018-04-28 DIAGNOSIS — R7989 Other specified abnormal findings of blood chemistry: Secondary | ICD-10-CM

## 2018-04-28 DIAGNOSIS — R945 Abnormal results of liver function studies: Secondary | ICD-10-CM

## 2018-04-29 ENCOUNTER — Other Ambulatory Visit: Payer: Self-pay

## 2018-04-29 ENCOUNTER — Inpatient Hospital Stay (HOSPITAL_COMMUNITY)
Admission: EM | Admit: 2018-04-29 | Discharge: 2018-05-01 | DRG: 446 | Disposition: A | Discharge status: Home / Self Care | Payer: BLUE CROSS/BLUE SHIELD | Attending: Family Medicine | Admitting: Family Medicine

## 2018-04-29 ENCOUNTER — Ambulatory Visit
Admission: RE | Admit: 2018-04-29 | Discharge: 2018-04-29 | Disposition: A | Discharge status: Home / Self Care | Payer: BLUE CROSS/BLUE SHIELD | Source: Ambulatory Visit | Attending: Gastroenterology | Admitting: Gastroenterology

## 2018-04-29 ENCOUNTER — Encounter (HOSPITAL_COMMUNITY): Payer: Self-pay | Admitting: Emergency Medicine

## 2018-04-29 DIAGNOSIS — K838 Other specified diseases of biliary tract: Secondary | ICD-10-CM

## 2018-04-29 DIAGNOSIS — K8050 Calculus of bile duct without cholangitis or cholecystitis without obstruction: Secondary | ICD-10-CM

## 2018-04-29 DIAGNOSIS — Z91040 Latex allergy status: Secondary | ICD-10-CM | POA: Diagnosis not present

## 2018-04-29 DIAGNOSIS — E876 Hypokalemia: Secondary | ICD-10-CM | POA: Diagnosis present

## 2018-04-29 DIAGNOSIS — E039 Hypothyroidism, unspecified: Secondary | ICD-10-CM | POA: Diagnosis present

## 2018-04-29 DIAGNOSIS — Z7951 Long term (current) use of inhaled steroids: Secondary | ICD-10-CM

## 2018-04-29 DIAGNOSIS — J45909 Unspecified asthma, uncomplicated: Secondary | ICD-10-CM

## 2018-04-29 DIAGNOSIS — R945 Abnormal results of liver function studies: Secondary | ICD-10-CM

## 2018-04-29 DIAGNOSIS — K8051 Calculus of bile duct without cholangitis or cholecystitis with obstruction: Principal | ICD-10-CM | POA: Diagnosis present

## 2018-04-29 DIAGNOSIS — Z9049 Acquired absence of other specified parts of digestive tract: Secondary | ICD-10-CM

## 2018-04-29 DIAGNOSIS — Z79899 Other long term (current) drug therapy: Secondary | ICD-10-CM

## 2018-04-29 DIAGNOSIS — R51 Headache: Secondary | ICD-10-CM | POA: Diagnosis not present

## 2018-04-29 DIAGNOSIS — Z885 Allergy status to narcotic agent status: Secondary | ICD-10-CM | POA: Diagnosis not present

## 2018-04-29 DIAGNOSIS — R740 Nonspecific elevation of levels of transaminase and lactic acid dehydrogenase [LDH]: Secondary | ICD-10-CM | POA: Diagnosis present

## 2018-04-29 DIAGNOSIS — J302 Other seasonal allergic rhinitis: Secondary | ICD-10-CM | POA: Diagnosis present

## 2018-04-29 DIAGNOSIS — F3170 Bipolar disorder, currently in remission, most recent episode unspecified: Secondary | ICD-10-CM | POA: Diagnosis present

## 2018-04-29 DIAGNOSIS — H8109 Meniere's disease, unspecified ear: Secondary | ICD-10-CM | POA: Diagnosis present

## 2018-04-29 DIAGNOSIS — R7989 Other specified abnormal findings of blood chemistry: Secondary | ICD-10-CM

## 2018-04-29 DIAGNOSIS — Z79890 Hormone replacement therapy (postmenopausal): Secondary | ICD-10-CM | POA: Diagnosis not present

## 2018-04-29 HISTORY — DX: Meniere's disease, unspecified ear: H81.09

## 2018-04-29 LAB — URINALYSIS, ROUTINE W REFLEX MICROSCOPIC
BILIRUBIN URINE: NEGATIVE
GLUCOSE, UA: NEGATIVE mg/dL
Hgb urine dipstick: NEGATIVE
KETONES UR: NEGATIVE mg/dL
Leukocytes, UA: NEGATIVE
Nitrite: NEGATIVE
PH: 6 (ref 5.0–8.0)
Protein, ur: NEGATIVE mg/dL
Specific Gravity, Urine: 1.017 (ref 1.005–1.030)

## 2018-04-29 LAB — CBC WITH DIFFERENTIAL/PLATELET
Abs Immature Granulocytes: 0 10*3/uL (ref 0.0–0.1)
Basophils Absolute: 0 10*3/uL (ref 0.0–0.1)
Basophils Relative: 0 %
EOS PCT: 0 %
Eosinophils Absolute: 0 10*3/uL (ref 0.0–0.7)
HCT: 42.5 % (ref 36.0–46.0)
HEMOGLOBIN: 13.8 g/dL (ref 12.0–15.0)
Immature Granulocytes: 0 %
LYMPHS ABS: 1.7 10*3/uL (ref 0.7–4.0)
LYMPHS PCT: 23 %
MCH: 31.2 pg (ref 26.0–34.0)
MCHC: 32.5 g/dL (ref 30.0–36.0)
MCV: 96.2 fL (ref 78.0–100.0)
MONOS PCT: 7 %
Monocytes Absolute: 0.5 10*3/uL (ref 0.1–1.0)
Neutro Abs: 5 10*3/uL (ref 1.7–7.7)
Neutrophils Relative %: 70 %
Platelets: 324 10*3/uL (ref 150–400)
RBC: 4.42 MIL/uL (ref 3.87–5.11)
RDW: 12.3 % (ref 11.5–15.5)
WBC: 7.3 10*3/uL (ref 4.0–10.5)

## 2018-04-29 LAB — COMPREHENSIVE METABOLIC PANEL
ALBUMIN: 3.9 g/dL (ref 3.5–5.0)
ALK PHOS: 587 U/L — AB (ref 38–126)
ALT: 690 U/L — ABNORMAL HIGH (ref 0–44)
ANION GAP: 8 (ref 5–15)
AST: 379 U/L — AB (ref 15–41)
BUN: 8 mg/dL (ref 6–20)
CO2: 24 mmol/L (ref 22–32)
Calcium: 10.1 mg/dL (ref 8.9–10.3)
Chloride: 105 mmol/L (ref 98–111)
Creatinine, Ser: 0.91 mg/dL (ref 0.44–1.00)
GFR calc Af Amer: >60 mL/min (ref >60)
GLUCOSE: 97 mg/dL (ref 70–99)
Potassium: 3.7 mmol/L (ref 3.5–5.1)
Sodium: 137 mmol/L (ref 135–145)
TOTAL PROTEIN: 6.7 g/dL (ref 6.5–8.1)
Total Bilirubin: 1.1 mg/dL (ref 0.3–1.2)

## 2018-04-29 LAB — LIPASE, BLOOD: LIPASE: 27 U/L (ref 11–51)

## 2018-04-29 MED ORDER — HYDROMORPHONE HCL 1 MG/ML IJ SOLN
0.5000 mg | Freq: Once | INTRAMUSCULAR | Status: AC
  Administered 2018-04-29: 0.5 mg via INTRAVENOUS
  Filled 2018-04-29: qty 1

## 2018-04-29 MED ORDER — ENOXAPARIN SODIUM 40 MG/0.4ML ~~LOC~~ SOLN
40.0000 mg | SUBCUTANEOUS | Status: DC
  Administered 2018-04-29 – 2018-04-30 (×2): 40 mg via SUBCUTANEOUS
  Filled 2018-04-29 (×2): qty 0.4

## 2018-04-29 MED ORDER — POLYETHYLENE GLYCOL 3350 17 G PO PACK: 17.0000 g | PACK | Freq: Every day | ORAL | Status: DC | PRN

## 2018-04-29 MED ORDER — ONDANSETRON HCL 4 MG/2ML IJ SOLN
4.0000 mg | Freq: Four times a day (QID) | INTRAMUSCULAR | Status: DC | PRN
  Administered 2018-04-30 (×2): 4 mg via INTRAVENOUS
  Filled 2018-04-29 (×2): qty 2

## 2018-04-29 MED ORDER — LEVOTHYROXINE SODIUM 100 MCG PO TABS
300.0000 ug | ORAL_TABLET | Freq: Every day | ORAL | Status: DC
  Administered 2018-04-30: 300 ug via ORAL
  Filled 2018-04-29: qty 3

## 2018-04-29 MED ORDER — MOMETASONE FURO-FORMOTEROL FUM 200-5 MCG/ACT IN AERO
2.0000 | INHALATION_SPRAY | Freq: Two times a day (BID) | RESPIRATORY_TRACT | Status: DC
  Administered 2018-04-30 (×2): 2 via RESPIRATORY_TRACT
  Filled 2018-04-29: qty 8.8

## 2018-04-29 MED ORDER — MINOCYCLINE HCL 50 MG PO CAPS
50.0000 mg | ORAL_CAPSULE | Freq: Two times a day (BID) | ORAL | Status: DC
  Administered 2018-04-29 – 2018-04-30 (×3): 50 mg via ORAL
  Filled 2018-04-29 (×6): qty 1

## 2018-04-29 MED ORDER — CARBAMAZEPINE ER 200 MG PO TB12
200.0000 mg | ORAL_TABLET | Freq: Every day | ORAL | Status: DC
  Administered 2018-04-29 – 2018-04-30 (×2): 200 mg via ORAL
  Filled 2018-04-29 (×4): qty 1

## 2018-04-29 MED ORDER — SODIUM CHLORIDE 0.9 % IV SOLN
INTRAVENOUS | Status: DC
  Administered 2018-04-29 – 2018-04-30 (×5): via INTRAVENOUS

## 2018-04-29 MED ORDER — LAMOTRIGINE 100 MG PO TABS
400.0000 mg | ORAL_TABLET | Freq: Every day | ORAL | Status: DC
  Administered 2018-04-29 – 2018-04-30 (×2): 400 mg via ORAL
  Filled 2018-04-29 (×2): qty 4

## 2018-04-29 MED ORDER — GADOBENATE DIMEGLUMINE 529 MG/ML IV SOLN
18.0000 mL | Freq: Once | INTRAVENOUS | Status: AC | PRN
  Administered 2018-04-29: 18 mL via INTRAVENOUS

## 2018-04-29 MED ORDER — LITHIUM CARBONATE ER 450 MG PO TBCR
900.0000 mg | EXTENDED_RELEASE_TABLET | Freq: Every day | ORAL | Status: DC
  Administered 2018-04-29 – 2018-04-30 (×2): 900 mg via ORAL
  Filled 2018-04-29 (×3): qty 2

## 2018-04-29 MED ORDER — MONTELUKAST SODIUM 10 MG PO TABS
10.0000 mg | ORAL_TABLET | Freq: Every day | ORAL | Status: DC
  Administered 2018-04-30: 10 mg via ORAL
  Filled 2018-04-29: qty 1

## 2018-04-29 MED ORDER — ARIPIPRAZOLE 10 MG PO TABS
20.0000 mg | ORAL_TABLET | Freq: Every day | ORAL | Status: DC
  Administered 2018-04-29 – 2018-04-30 (×2): 20 mg via ORAL
  Filled 2018-04-29 (×2): qty 2

## 2018-04-29 MED ORDER — HYDROMORPHONE HCL 1 MG/ML IJ SOLN
0.5000 mg | Freq: Once | INTRAMUSCULAR | Status: AC
  Administered 2018-04-29: 0.5 mg via INTRAVENOUS
  Filled 2018-04-29 (×2): qty 1

## 2018-04-29 MED ORDER — ONDANSETRON HCL 4 MG PO TABS: 4.0000 mg | ORAL_TABLET | Freq: Four times a day (QID) | ORAL | Status: DC | PRN

## 2018-04-29 MED ORDER — HYDROCHLOROTHIAZIDE 25 MG PO TABS
25.0000 mg | ORAL_TABLET | Freq: Every day | ORAL | Status: DC
  Administered 2018-04-30: 25 mg via ORAL
  Filled 2018-04-29: qty 1

## 2018-04-29 MED ORDER — SODIUM CHLORIDE 0.9 % IV BOLUS
1000.0000 mL | Freq: Once | INTRAVENOUS | Status: AC
  Administered 2018-04-29: 1000 mL via INTRAVENOUS

## 2018-04-29 MED ORDER — CARBAMAZEPINE ER 200 MG PO CP12: 200.0000 mg | ORAL_CAPSULE | Freq: Every day | ORAL | Status: DC

## 2018-04-29 MED ORDER — HYDROMORPHONE HCL 1 MG/ML IJ SOLN
0.5000 mg | INTRAMUSCULAR | Status: DC | PRN
  Administered 2018-04-30 (×2): 0.5 mg via INTRAVENOUS
  Filled 2018-04-29 (×2): qty 0.5

## 2018-04-29 MED ORDER — DEXTROSE-NACL 5-0.45 % IV SOLN
INTRAVENOUS | Status: DC
  Administered 2018-04-29 – 2018-05-01 (×3): via INTRAVENOUS

## 2018-04-29 NOTE — ED Triage Notes (Signed)
Pt presents to the ER for severe abdominal pain. She reports she had an MRI today and results showed gallstones. She is here for eval for ERCP or removal of gallbladder. Nauseated at times, denies fever or vomiting.

## 2018-04-29 NOTE — ED Provider Notes (Signed)
Patient placed in Quick Look pathway, seen and evaluated   Chief Complaint: abdominal pain  HPI:  Jenna Navarro is a 42 y.o. female who presents to the ED with epigastric and RUQ abdominal pain. Patient had MRI of abdomen today and it shows a 5mm choledocholith at the ampulla. Also dilated CBD 11mm diameter.   ROS: GI: abdominal pain  Physical Exam:  BP 116/77 (BP Location: Right Arm)   Pulse 79   Temp 98.7 F (37.1 C) (Oral)   Ht 5\' 6"  (1.676 m)   Wt 91.2 kg   LMP 04/24/2018   SpO2 99%   BMI 32.44 kg/m    Gen: No distress  Neuro: Awake and Alert  Skin: Warm and dry  Abdomen: tender with palpation epigastric and RUQ   Initiation of care has begun. The patient has been counseled on the process, plan, and necessity for staying for the completion/evaluation, and the remainder of the medical screening examination    Janne Napoleon, NP 04/29/18 1510    Charlynne Pander, MD 04/29/18 [phone]

## 2018-04-29 NOTE — ED Provider Notes (Signed)
[facility] EMERGENCY DEPARTMENT Provider Note   CSN: [id number] Arrival date & time: 04/29/18  1432     History   Chief Complaint Chief Complaint  Patient presents with  . Cholelithiasis    HPI Jenna Navarro is a 42 y.o. female.  HPI   42 year old female with a past medical history of bipolar, asthma presents today with right upper quadrant abdominal pain.  Patient symptoms have been ongoing for approximately 1 week.  She was seen in the emergency room on 04/24/2018 with a reassuring CT but did have significant elevation in her LFTs.  She was discharged with outpatient GI follow-up.  She notes seeing Dr. Marca Ancona as an outpatient, she was sent for outpatient MRI which was performed today.  She was noted to have obstructing 5 mm choledocholithiasis at the ampulla with intrahepatic biliary ductal dilatation.  She was referred to the emergency room for further evaluation.  Patient notes continued right upper quadrant abdominal pain no vomiting, no fever.  Past Medical History:  Diagnosis Date  . Asthma   . Bipolar 1 disorder ([facility])   . Meniere's disease   . Thyroid disease    hashimoto     Patient Active Problem List   Diagnosis Date Noted  . Choledocholithiasis 04/29/2018  . Bipolar disorder in full remission ([facility])   . Uncomplicated asthma   . Hypothyroidism   . Cholecystitis 03/23/2017    Past Surgical History:  Procedure Laterality Date  . CHOLECYSTECTOMY N/A 03/23/2017   Procedure: LAPAROSCOPIC CHOLECYSTECTOMY, W/ POSS IOC;  Surgeon: Harriette Bouillon, MD;  Location: [facility] OR;  Service: General;  Laterality: N/A;     OB History   None      Home Medications    Prior to Admission medications   Medication Sig Start Date End Date Taking? Authorizing Provider  ADVAIR DISKUS 250-50 MCG/DOSE AEPB Inhale 1 puff into the lungs 2 (two) times daily. 03/07/17   [provider]  albuterol (PROVENTIL HFA;VENTOLIN HFA) 108 (90 Base) MCG/ACT inhaler Inhale  1-2 puffs into the lungs every 6 (six) hours as needed for wheezing or shortness of breath.    [provider]  ARIPiprazole (ABILIFY) 10 MG tablet Take 10 mg by mouth at bedtime. 02/05/17   [provider]  Armodafinil 250 MG tablet Take 250 mg by mouth daily. 03/16/17   [provider]  lamoTRIgine (LAMICTAL) 100 MG tablet Take 300 mg by mouth at bedtime. 02/05/17   [provider]  levothyroxine (SYNTHROID, LEVOTHROID) 150 MCG tablet Take 150 mcg by mouth daily. 03/10/17   [provider]  lithium carbonate (ESKALITH) 450 MG CR tablet Take 1,350 mg by mouth at bedtime. 03/07/17   [provider]  methocarbamol (ROBAXIN) 500 MG tablet Take 1 tablet (500 mg total) by mouth every 6 (six) hours as needed for muscle spasms. 03/26/17   Rayburn, Alphonsus Sias, PA-C  minocycline (DYNACIN) 100 MG tablet Take 100 mg by mouth 2 (two) times daily.    [provider]  montelukast (SINGULAIR) 10 MG tablet Take 10 mg by mouth daily. 01/17/17   [provider]  NORTREL 1/35, 28, tablet Take 1 tablet by mouth at bedtime. 03/07/17   [provider]  ondansetron (ZOFRAN ODT) 4 MG disintegrating tablet Take 1 tablet (4 mg total) by mouth every 8 (eight) hours as needed for nausea or vomiting. 04/24/18   Henderly, Britni A, PA-C  oxyCODONE (OXY IR/ROXICODONE) 5 MG immediate release tablet Take 1 tablet (5 mg  total) by mouth every 4 (four) hours as needed for moderate pain or severe pain. 03/26/17   Rayburn, Alphonsus Sias, PA-C  risperiDONE (RISPERDAL) 0.25 MG tablet Take 0.75 mg by mouth at bedtime. 02/05/17   [provider]    Family History No family history on file.  Social History Social History   Tobacco Use  . Smoking status: Never Smoker  . Smokeless tobacco: Never Used  Substance Use Topics  . Alcohol use: No  . Drug use: No     Allergies   Codeine and Latex   Review of Systems Review of Systems  All other systems reviewed  and are negative.    Physical Exam Updated Vital Signs BP 121/70   Pulse 76   Temp 98.7 F (37.1 C) (Oral)   Ht 5\' 6"  (1.676 m)   Wt 91.2 kg   LMP 04/24/2018   SpO2 97%   BMI 32.44 kg/m   Physical Exam  Constitutional: She is oriented to person, place, and time. She appears well-developed and well-nourished.  HENT:  Head: Normocephalic and atraumatic.  Eyes: Pupils are equal, round, and reactive to light. Conjunctivae are normal. Right eye exhibits no discharge. Left eye exhibits no discharge. No scleral icterus.  Neck: Normal range of motion. No JVD present. No tracheal deviation present.  Pulmonary/Chest: Effort normal. No stridor.  Abdominal:  Tenderness palpation right upper quadrant and epigastric region-remainder of abdomen soft with notenderness palpation  Neurological: She is alert and oriented to person, place, and time. Coordination normal.  Psychiatric: She has a normal mood and affect. Her behavior is normal. Judgment and thought content normal.  Nursing note and vitals reviewed.    ED Treatments / Results  Labs (all labs ordered are listed, but only abnormal results are displayed) Labs Reviewed  COMPREHENSIVE METABOLIC PANEL - Abnormal; Notable for the following components:      Result Value   AST 379 (*)    ALT 690 (*)    Alkaline Phosphatase 587 (*)    All other components within normal limits  CBC WITH DIFFERENTIAL/PLATELET  LIPASE, BLOOD  URINALYSIS, ROUTINE W REFLEX MICROSCOPIC    EKG None  Radiology Mr Abdomen Mrcp W Wo Contast  Result Date: 04/29/2018 CLINICAL DATA:  Severe right upper quadrant abdominal pain, nausea and vomiting. History of cholecystectomy in 2018. Abnormal liver function tests. EXAM: MRI ABDOMEN WITHOUT AND WITH CONTRAST (INCLUDING MRCP) TECHNIQUE: Multiplanar multisequence MR imaging of the abdomen was performed both before and after the administration of intravenous contrast. Heavily T2-weighted images of the biliary and  pancreatic ducts were obtained, and three-dimensional MRCP images were rendered by post processing. CONTRAST:  18mL MULTIHANCE GADOBENATE DIMEGLUMINE 529 MG/ML IV SOLN COMPARISON:  04/24/2018 CT abdomen/pelvis and abdominal sonogram. FINDINGS: Lower chest: No acute abnormality at the lung bases. Hepatobiliary: Normal liver size and configuration. No hepatic steatosis. No liver mass. Cholecystectomy. There is new mild diffuse intrahepatic biliary ductal dilatation. Dilated common bile duct with CBD diameter 11 mm, increased from 7 mm on 04/24/2018 CT. There is an obstructing 5 mm choledocholithiasis at the ampulla. No additional filling defects in the bile ducts. No biliary strictures. Pancreas: No pancreatic mass or duct dilation.  No pancreas divisum. Spleen: Normal size. No mass. Adrenals/Urinary Tract: Normal adrenals. No hydronephrosis. No renal masses. Normal size kidneys. Limited visualization of known nonobstructing stone in the lower right kidney. Stomach/Bowel: Normal non-distended stomach. Visualized small and large bowel is normal caliber, with no bowel wall thickening. Vascular/Lymphatic: Normal caliber abdominal  aorta. Patent portal, splenic, hepatic and renal veins. No pathologically enlarged lymph nodes in the abdomen. Other: No abdominal ascites or focal fluid collection. Musculoskeletal: No aggressive appearing focal osseous lesions. IMPRESSION: 1. Obstructing 5 mm choledocholith at the ampulla. New diffuse intrahepatic biliary ductal dilatation. Dilated CBD (11 mm diameter), increased. 2. Nonobstructing lower right nephrolithiasis. These results will be called to the ordering clinician or representative by the Radiologist Assistant, and communication documented in the PACS or zVision Dashboard. Electronically Signed   By: Delbert Phenix M.D.   On: 04/29/2018 13:54    Procedures Procedures (including critical care time)  Medications Ordered in ED Medications  HYDROmorphone (DILAUDID) injection  0.5 mg (0.5 mg Intravenous Given 04/29/18 1722)  sodium chloride 0.9 % bolus 1,000 mL (1,000 mLs Intravenous New Bag/Given 04/29/18 1726)     Initial Impression / Assessment and Plan / ED Course  I have reviewed the triage vital signs and the nursing notes.  Pertinent labs & imaging results that were available during my care of the patient were reviewed by me and considered in my medical decision making (see chart for details).     Labs: CBC, CMP, lipase  Imaging:  Consults: Gastroenterology Dr. Marca Ancona, Family Medicine   Therapeutics: Dilaudid, normal saline  Discharge Meds:   Assessment/Plan: 42 year old female presents today with choledocholithiasis.  Patient afebrile with no signs of systemic illness.  Discussed case with usual gastroenterology Dr. Remus Blake who recommends hospital admission with likely ERCP.  Due to the current load unlikely to happen tomorrow, but will keep patient n.p.o. after midnight with inpatient consultation.    Final Clinical Impressions(s) / ED Diagnoses   Final diagnoses:  Choledocholithiasis    ED Discharge Orders    None       Eyvonne Mechanic, PA-C 04/29/18 1847    Mancel Bale, MD 05/01/18 1410

## 2018-04-29 NOTE — H&P (Addendum)
[facility] Admission History and Physical Service Pager: [phone]  Patient name: Jenna Navarro Medical record number: [id number] Date of birth: July 29, 1976 Age: 42 y.o. Gender: female  Primary Care Provider: Patient, No Pcp Per Consultants: Gastroenterology Code Status: Full  Chief Complaint: Epigastric and RUQ pain  Assessment and Plan: Jenna Navarro is a 42 y.o. female presenting with epigastric pain and RUQ pain x 1 week. PMH is significant for cholecystectomy in 2018, bipolar disorder, asthma, and hypothyroidism.  Obstructing choledocholithiasis of the ampulla: Patient presented with abdominal pain 2/2 obstructing choledocholithiasis of the ampulla as seen on MRCP. No leukocytosis on CBC and is afebrile thus no concern for ascending cholangitis. Elevated AST and ALT on CMP. Lipase WNL. She is currently stable and comfortable after dose of dilaudid 0.'5mg'$  x1 in ED - admit to [facility], attending Dr. Erin Hearing [facility] following, ERCP tentatively scheduled for 10/4, with possibility for tomorrow. Appreciate recommendations - NPO except for sips with meds - D5 1/2NS mIVF - PRN Dilaudid 0.'5mg'$  q4 PRN - PRN Zofran - CBC, CMP, Cr, UA in AM   Bipolar Disorder: Stable on home meds - Continue home Aripiprazole '20mg'$  qHS - Continue home Carbamazepine '200mg'$  qHS - Continue home Lamotrigine '400mg'$  qHS - Continue home lithium '900mg'$  qHS - Continue home minocycline '50mg'$   - Hold Modafinil and Xanax as patient is not currently taking them  Hypothyroidism: stable. Patient has history of hypothyroidism secondary to Hashimoto's Thyroiditis -TSH ordered -Continue home Synthroid 337mg QD  Asthma: stable. Well controlled. -Continue home Dulera BID -Continue home Singulair '10mg'$  QD  Meniere's Disease - Continue home HCTZ '25mg'$  QD and minocycline '50mg'$  BID  FEN/GI: D5 1/2NS mIVF, Miralax  Prophylaxis: Lovenox   Disposition: Pending ERCP and clinical  improvement  History of Present Illness:  Jenna ABRAHAis a 42y.o. female presenting with severe mid-epigastric and RUQ pain x 1 week. Pain began suddenly on Wednesday (9/25) and continued to worsen over the next couple of days. She also admitted to inability to eat due to the severity of the pain. She went to the ED on Friday (9/27) and had labs and imaging performed. Both CT and abdominal ultrasound were unremarkable for pathology of RUQ. CBC was WNL and CMP only significant for elevated AST and ALTs. Patient was discharged with zofran and oxycodone '5mg'$  and close follow-up with GI. She saw GI Dr. KTherisa Doyneon 10/1 in which she scheduled her for an MRI performed today. MRI came back showing obstructing choledocholithiasis at the ampulla and she was instructed to come to the ER. Patient noted some yellow-orange diarrhea that began yesterday which she attributed to the zofran. Patient has a PMH of cholecystectomy in 2018 due to a gangrenous gallbladder.   In the ED, patient was hemodynamically stable and afebrile. CBC was WNL with no leukocytosis. CMP remarkable for elevated AST 379, ALT 690, and Alk Phos 587. Lipase WNL. Patient received a dose of Dilaudid which controlled her pain well.   Review Of Systems: Per HPI with the following additions:  Review of Systems  Constitutional: Positive for malaise/fatigue. Negative for chills and fever.  Respiratory: Negative for cough and shortness of breath.   Cardiovascular: Negative for chest pain.  Gastrointestinal: Positive for abdominal pain, diarrhea, nausea and vomiting. Negative for blood in stool and melena.  Genitourinary: Negative for dysuria, frequency, hematuria and urgency.  Skin: Negative for itching and rash.   Patient Active Problem List   Diagnosis Date Noted  . Choledocholithiasis  04/29/2018  . Bipolar disorder in full remission ([place])   . Uncomplicated asthma   . Hypothyroidism   . Cholecystitis 03/23/2017    Past Medical  History: Past Medical History:  Diagnosis Date  . Asthma   . Bipolar 1 disorder ([place])   . Meniere's disease   . Thyroid disease    hashimoto     Past Surgical History: Past Surgical History:  Procedure Laterality Date  . CHOLECYSTECTOMY N/A 03/23/2017   Procedure: LAPAROSCOPIC CHOLECYSTECTOMY, W/ POSS IOC;  Surgeon: Erroll Luna, MD;  Location: [place];  Service: General;  Laterality: N/A;    Social History: Social History   Tobacco Use  . Smoking status: Never Smoker  . Smokeless tobacco: Never Used  Substance Use Topics  . Alcohol use: No  . Drug use: No   Additional social history: 1 glass wine a month, no recreational drug use.  Please also refer to relevant sections of EMR.  Family History: No family history on file. Mother and father and all siblings are healthy without any medical problems.  Allergies and Medications: Allergies  Allergen Reactions  . Codeine Shortness Of Breath, Palpitations and Other (See Comments)    Rapid heart rate and sweating also   . Latex Hives   No current facility-administered medications on file prior to encounter.    Current Outpatient Medications on File Prior to Encounter  Medication Sig Dispense Refill  . acetaminophen (TYLENOL) 500 MG tablet Take 500 mg by mouth every 6 (six) hours as needed (for pain or headaches).    Marland Kitchen acyclovir ointment (ZOVIRAX) 5 % Apply 1 application topically See admin instructions. Apply to affected areas every 4 hours as needed for fever blisters- for up to 5 days  2  . ADVAIR DISKUS 250-50 MCG/DOSE AEPB Inhale 1 puff into the lungs 2 (two) times daily.  3  . albuterol (PROVENTIL HFA;VENTOLIN HFA) 108 (90 Base) MCG/ACT inhaler Inhale 1-2 puffs into the lungs every 6 (six) hours as needed for wheezing or shortness of breath.    . ALPRAZolam (XANAX) 0.5 MG tablet Take 0.25-0.5 mg by mouth daily as needed for anxiety.   1  . ARIPiprazole (ABILIFY) 10 MG tablet Take 20 mg by mouth at bedtime.   1  .  cetirizine (ZYRTEC) 10 MG tablet Take 10 mg by mouth daily as needed for allergies.    Marland Kitchen DECARA 72094 units capsule Take 50,000 Units by mouth every Sunday.  2  . EQUETRO 200 MG CP12 12 hr capsule Take 600 mg by mouth at bedtime.  2  . Fluocinonide 0.1 % CREA Apply 1 application topically daily as needed (for rashes on arms).   2  . fluticasone (FLONASE) 50 MCG/ACT nasal spray Place 1-2 sprays into both nostrils 2 (two) times daily as needed for allergies or rhinitis (for seasonal allergies).    . hydrochlorothiazide (HYDRODIURIL) 25 MG tablet Take 25 mg by mouth every morning.  9  . ibuprofen (ADVIL,MOTRIN) 200 MG tablet Take 800 mg by mouth daily as needed (for pain or headaches).    . lamoTRIgine (LAMICTAL) 200 MG tablet Take 400 mg by mouth at bedtime.   2  . levothyroxine (SYNTHROID, LEVOTHROID) 300 MCG tablet Take 300 mcg by mouth daily before breakfast.    . lithium carbonate (ESKALITH) 450 MG CR tablet Take 900 mg by mouth at bedtime.   1  . minocycline (MINOCIN,DYNACIN) 50 MG capsule Take 50 mg by mouth 2 (two) times daily.    . montelukast (  SINGULAIR) 10 MG tablet Take 10 mg by mouth daily.  3  . ondansetron (ZOFRAN ODT) 4 MG disintegrating tablet Take 1 tablet (4 mg total) by mouth every 8 (eight) hours as needed for nausea or vomiting. 20 tablet 0  . oxyCODONE (OXY IR/ROXICODONE) 5 MG immediate release tablet Take 1 tablet (5 mg total) by mouth every 4 (four) hours as needed for moderate pain or severe pain. 15 tablet 0  . valACYclovir (VALTREX) 1000 MG tablet Take 2,000 mg by mouth 2 (two) times daily as needed (for fever blisters- for 1 day, as directed).       Objective: BP 118/76   Pulse 72   Temp 98.7 F (37.1 C) (Oral)   Ht '5\' 6"'$  (1.676 m)   Wt 91.2 kg   LMP 04/24/2018   SpO2 99%   BMI 32.44 kg/m   Physical Exam:  Gen: NAD, alert, non-toxic, well-appearing, sitting comfortably in hospital bed Skin: Warm and dry. No obvious rashes, lesions, or trauma. HEENT: NCAT,  PERRLA, No conjunctival pallor or injection. No scleral icterus or injection.  MMM. Oropharynx clear without erythema or tonsillar enlargement or exudate. CV: RRR.  RP & DPs 2+ bilaterally. No bilateral LE edema Resp: CTAB.  No wheezing, rales, abnormal lung sounds.  No increased WOB Abd: Tender to palpation in mid-epigastric area and RUQ. Positive bowel sounds. Psych: Cooperative with exam. Pleasant. Makes eye contact. Speech normal. Extremities: Moves all extremities spontaneously   Labs and Imaging: CBC  Recent Labs  Lab 04/29/18 1521  WBC 7.3  HGB 13.8  HCT 42.5  PLT 324     CMP Latest Ref Rng & Units 04/29/2018 04/24/2018 03/25/2017  Glucose 70 - 99 mg/dL 97 103(H) 111(H)  BUN 6 - 20 mg/dL 8 7 <5(L)  Creatinine 0.44 - 1.00 mg/dL 0.91 0.88 0.75  Sodium 135 - 145 mmol/L 137 138 136  Potassium 3.5 - 5.1 mmol/L 3.7 3.9 3.7  Chloride 98 - 111 mmol/L 105 106 105  CO2 22 - 32 mmol/L '24 22 24  '$ Calcium 8.9 - 10.3 mg/dL 10.1 9.9 8.8(L)  Total Protein 6.5 - 8.1 g/dL 6.7 6.7 6.0(L)  Total Bilirubin 0.3 - 1.2 mg/dL 1.1 1.4(H) 1.3(H)  Alkaline Phos 38 - 126 U/L 587(H) 230(H) 168(H)  AST 15 - 41 U/L 379(H) 608(H) 84(H)  ALT 0 - 44 U/L 690(H) 874(H) 123(H)   Urinalysis    Component Value Date/Time   COLORURINE YELLOW 04/24/2018 2030   [place] 04/24/2018 2030   [place] 1.023 04/24/2018 2030   PHURINE 7.0 04/24/2018 2030   GLUCOSEU NEGATIVE 04/24/2018 2030   HGBUR MODERATE (A) 04/24/2018 2030   [place] NEGATIVE 04/24/2018 2030   [place] 04/24/2018 2030   PROTEINUR NEGATIVE 04/24/2018 2030   NITRITE NEGATIVE 04/24/2018 2030   LEUKOCYTESUR NEGATIVE 04/24/2018 2030    Ct Abdomen Pelvis W Contrast  Result Date: 04/24/2018 CLINICAL DATA:  Epigastric tightness with nausea vomiting for 2 days. EXAM: CT ABDOMEN AND PELVIS WITH CONTRAST TECHNIQUE: Multidetector CT imaging of the abdomen and pelvis was performed using the standard protocol following bolus  administration of intravenous contrast. CONTRAST:  172m OMNIPAQUE IOHEXOL 300 MG/ML  SOLN COMPARISON:  None. FINDINGS: Lower chest: No acute abnormality. Hepatobiliary: Fatty steatosis. Previous cholecystectomy. No liver mass. Portal vein is patent. Pancreas: Unremarkable. No pancreatic ductal dilatation or surrounding inflammatory changes. Spleen: Normal in size without focal abnormality. Adrenals/Urinary Tract: There is a small staghorn calculus in the lower pole of the right kidney measuring 13  mm. No other renal stones. No suspicious renal masses. No hydronephrosis or perinephric stranding. No ureterectasis or ureteral stones. The adrenal glands are normal. The bladder is unremarkable. Stomach/Bowel: The stomach and small bowel are normal. The colon and appendix are normal. Vascular/Lymphatic: No significant vascular findings are present. No enlarged abdominal or pelvic lymph nodes. Reproductive: Probable small fibroid in the anterior uterus is seen on axial image 73. The left ovary is normal. There appears to be a dominant follicle in the right ovary measuring up to 2.3 cm. Other: No free air free fluid. Musculoskeletal: No acute or significant osseous findings. IMPRESSION: 1. No acute abnormality identified. 2. Small staghorn calculus measuring 13 mm in the lower pole the right kidney. No renal obstruction. 3. Probable small fibroid in the uterus. 4. Dominant follicle in the right ovary measuring 2.3 cm. 5. No other acute abnormalities. Electronically Signed   By: Dorise Bullion III M.D   On: 04/24/2018 20:24   US Abdomen Limited  Result Date: 04/24/2018 CLINICAL DATA:  Right upper quadrant abdominal pain. EXAM: ULTRASOUND ABDOMEN LIMITED RIGHT UPPER QUADRANT COMPARISON:  Radiographs of March 22, 2017. FINDINGS: Gallbladder: Status post cholecystectomy. Common bile duct: Diameter: 7.3 mm which is within normal limits given post cholecystectomy status Liver: No focal lesion identified. Within normal  limits in parenchymal echogenicity. Portal vein is patent on color Doppler imaging with normal direction of blood flow towards the liver. IMPRESSION: Status post cholecystectomy. No other abnormality seen in the right upper quadrant of the abdomen. Electronically Signed   By: Marijo Conception, M.D.   On: 04/24/2018 18:56   Mr Abdomen Mrcp Moise Boring Contast  Result Date: 04/29/2018 CLINICAL DATA:  Severe right upper quadrant abdominal pain, nausea and vomiting. History of cholecystectomy in 2018. Abnormal liver function tests. EXAM: MRI ABDOMEN WITHOUT AND WITH CONTRAST (INCLUDING MRCP) TECHNIQUE: Multiplanar multisequence MR imaging of the abdomen was performed both before and after the administration of intravenous contrast. Heavily T2-weighted images of the biliary and pancreatic ducts were obtained, and three-dimensional MRCP images were rendered by post processing. CONTRAST:  18m MULTIHANCE GADOBENATE DIMEGLUMINE 529 MG/ML IV SOLN COMPARISON:  04/24/2018 CT abdomen/pelvis and abdominal sonogram. FINDINGS: Lower chest: No acute abnormality at the lung bases. Hepatobiliary: Normal liver size and configuration. No hepatic steatosis. No liver mass. Cholecystectomy. There is new mild diffuse intrahepatic biliary ductal dilatation. Dilated common bile duct with CBD diameter 11 mm, increased from 7 mm on 04/24/2018 CT. There is an obstructing 5 mm choledocholithiasis at the ampulla. No additional filling defects in the bile ducts. No biliary strictures. Pancreas: No pancreatic mass or duct dilation.  No pancreas divisum. Spleen: Normal size. No mass. Adrenals/Urinary Tract: Normal adrenals. No hydronephrosis. No renal masses. Normal size kidneys. Limited visualization of known nonobstructing stone in the lower right kidney. Stomach/Bowel: Normal non-distended stomach. Visualized small and large bowel is normal caliber, with no bowel wall thickening. Vascular/Lymphatic: Normal caliber abdominal aorta. Patent portal,  splenic, hepatic and renal veins. No pathologically enlarged lymph nodes in the abdomen. Other: No abdominal ascites or focal fluid collection. Musculoskeletal: No aggressive appearing focal osseous lesions. IMPRESSION: 1. Obstructing 5 mm choledocholith at the ampulla. New diffuse intrahepatic biliary ductal dilatation. Dilated CBD (11 mm diameter), increased. 2. Nonobstructing lower right nephrolithiasis. These results will be called to the ordering clinician or representative by the Radiologist Assistant, and communication documented in the PACS or zVision Dashboard. Electronically Signed   By: JIlona SorrelM.D.   On: 04/29/2018 13:54  Mina Marble [place], DO 04/29/2018, 7:40 PM PGY-1, [place] Intern pager: [phone], text pages welcome  FPTS Upper-Level Resident Addendum  I have independently interviewed and examined the patient. I have discussed the above with the original author and agree with their documentation. My edits for correction/addition/clarification are in blue. Please see also any attending notes.   Bufford Lope, DO PGY-3, [facility] 04/29/2018 8:57 PM  [place] Service pager: [phone] (text pages welcome through [facility])

## 2018-04-29 NOTE — Progress Notes (Signed)
Family Medicine Teaching Service Daily Progress Note Intern Pager: [phone]  Patient name: Jenna Navarro Medical record number: [id number] Date of birth: [birth year] Age: 42 y.o. Gender: female  Primary Care Provider: Patient, No Pcp Per Consultants: [facility] Code Status: Full code  Pt Overview and Major Events to Date:  04/29/2018 Admitted  Hospital Day: 2   Assessment and Plan: Jenna Navarro is a 42 y.o. female who presented with epigastric and RUQ pain x 1 week 2/2 to obstructing choledocholithiasis of the ampulla. PMHx is significant for Cholecystectomy in 2018 bipolar disorder, hypothyroidism, and asthma.  Obstructing choledocholithiasis of the ampulla: Patient presented with abdominal pain 2/2 obstructing choledocholithiasis of the ampulla as seen on MRCP. Patient has remained hemodynamically stable and afebrile overnight. Reports minimal pain this morning with last dose of Dilaudid at 8pm on 10/2. Labs CBC WNL. K 3.7>3.0, AST 379>153, ALT 690>490 , Cr 0.65, UA neg. Her pain has been well controlled on dilaudid 0.5mg .  - Follow-up GI recs - ERCP scheduled for 9:30 10/4 - Clear liquid diet, NPO after midnight  - Stop meds after evening dose and restart upon returning from surgery - PRN Dilaudid 0.5mg  q4 PRN - PRN zofran  - AM CMP   Headache: mild - One time dose of Ibuprofen 600 mg  - No Tylenol due to elevated liver enzymes  Hypokalemia: 3.7 > 3.0 -Repleated 80 mEQ K-dur  - Will continue to monitor - AM BMP  Nonobstructive Right Nephrolithiasis:  Small staghorn calculus in lower pole of right kidney measuring 13mm noted on CT and MRI. Patient complained of a sharp pain at right flank when she moved but pain went away immediately. (+) Lloyd's sign at right side. Does note she has had some mild pain over the last few weeks in this same area. -Recommend outpatient follow-up with urology for further evaluation of MRI and CT proven kidney stone  Bipolar Disorder: stable on  home meds - Continue home Aripiprazole 20mg  qHS - Continue home Carbamazepine 200mg  qHS - Continue home Lamotrigine 400mg  qHS - Continue home lithium 900mg  qHS  - Hold Modafinil and Xanax as patient is not currently taking them - Will hold meds after evening dose and will restart upon return from surgery  Hypothyroidism: stable Patient has history of hypothyroidism secondary to Hashimoto's Thyroiditis. TSH 0.03. - Continue home Synthroid 300 mcg QD - Consider adjustment of dose as outpatient consider TSH level - Hold after midnight  Asthma/Allergies: stable. Well controlled. -Continue home Dulera BID -Continue home Singulair 10mg  QD -Continue home Flonase  - Will hold medications after midnight and restart upon return from surgery   Meniere's Disease - Continue home HCTZ 25mg  QD and minocycline 50mg  BID - Will hold medications after midnight and restart upon return from surgery   Fluids:  . sodium chloride 20 mL/hr at 04/30/18 0656  . dextrose 5 % and 0.45% NaCl 100 mL/hr at 04/30/18 0656  Electrolytes: Repleat PRN  Nutrition: NPO GI: Miralax DVT ppx: Lovenox  Future labs: AM BMP Disposition: Pending ERCP and clinical improvement   Medications: Scheduled Meds: . ARIPiprazole  20 mg Oral QHS  . carbamazepine  200 mg Oral QHS  . enoxaparin (LOVENOX) injection  40 mg Subcutaneous Q24H  . hydrochlorothiazide  25 mg Oral Daily  .  HYDROmorphone (DILAUDID) injection  0.5 mg Intravenous Once  . lamoTRIgine  400 mg Oral QHS  . levothyroxine  300 mcg Oral QAC breakfast  . lithium carbonate  900 mg Oral QHS  .  minocycline  50 mg Oral Q12H  . mometasone-formoterol  2 puff Inhalation BID  . montelukast  10 mg Oral Daily  . potassium chloride  40 mEq Oral BID   Continuous Infusions: . sodium chloride 20 mL/hr at 04/30/18 0656  . dextrose 5 % and 0.45% NaCl 100 mL/hr at 04/30/18 0656   PRN Meds: fluticasone, HYDROmorphone (DILAUDID) injection, ondansetron **OR** ondansetron  (ZOFRAN) IV, polyethylene glycol  ================================================= ================================================= Subjective:  Patient reports doing well overnight. Pt has had minimal pain with last dose of Dilaudid at 8pm last night. She denies nausea or vomiting. Did have an episode of yellow diarrhea this morning with no signs of blood. She also complains of a frontal headache this morning that she has occasionally. She also requests her Flonase because her allergies seem to be acting up. Patient also complains of some right sided back pain that occurred for a moment when she moved.   Objective: Vital Signs Temp:  [97.7 F (36.5 C)-98.8 F (37.1 C)] 97.7 F (36.5 C) (10/03 1147) Pulse Rate:  [64-79] 70 (10/03 1147) Resp:  [18-20] 20 (10/03 1147) BP: (106-131)/(61-78) 106/65 (10/03 1147) SpO2:  [97 %-100 %] 97 % (10/03 1147) Weight:  [91.2 kg-91.9 kg] 91.9 kg (10/02 2031)  Intake/Output 10/02 0701 - 10/03 0700 In: 1284.3 [P.O.:240; I.V.:1044.3] Out: -   Physical Exam:  Gen: NAD, alert, non-toxic, well-appearing, lying comfortably in bed Skin: Warm and dry. No obvious rashes, lesions, or trauma. HEENT: NCAT No conjunctival pallor or injection.  MMM.  CV: RRR.  <2s capillary refill bilaterally.  RP & DPs 2+ bilaterally. No bilateral LE edema Resp: CTAB.  No wheezing, rales, abnormal lung sounds.  No increased WOB Abd: Mild tenderness to epigastric and RUQ. NTND along remainder of abdomen. Positive bowel sounds. Psych: Cooperative with exam. Pleasant. Makes eye contact. Speech normal. Extremities: Moves all extremities spontaneously  MSK: (+) Lloyd's sign at right flank, however R and L flank nontender to palpation   Laboratory: Recent Labs  Lab 04/24/18 1245 04/29/18 1521 04/30/18 0506  WBC 5.3 7.3 4.9  HGB 13.8 13.8 12.2  HCT 41.9 42.5 37.2  PLT 287 324 242   Recent Labs  Lab 04/24/18 1245 04/29/18 1521 04/30/18 0506  NA 138 137 135  K 3.9 3.7  3.0*  CL 106 105 110  CO2 22 24 21*  BUN 7 8 5*  CREATININE 0.88 0.91 0.65  CALCIUM 9.9 10.1 8.8*  PROT 6.7 6.7 5.6*  BILITOT 1.4* 1.1 0.6  ALKPHOS 230* 587* 454*  ALT 874* 690* 490*  AST 608* 379* 153*  GLUCOSE 103* 97 103*   Imaging/Diagnostic Tests: Mr Abdomen Mrcp W Wo Contast  Result Date: 04/29/2018 CLINICAL DATA:  Severe right upper quadrant abdominal pain, nausea and vomiting. History of cholecystectomy in 2018. Abnormal liver function tests. EXAM: MRI ABDOMEN WITHOUT AND WITH CONTRAST (INCLUDING MRCP) TECHNIQUE: Multiplanar multisequence MR imaging of the abdomen was performed both before and after the administration of intravenous contrast. Heavily T2-weighted images of the biliary and pancreatic ducts were obtained, and three-dimensional MRCP images were rendered by post processing. CONTRAST:  18mL MULTIHANCE GADOBENATE DIMEGLUMINE 529 MG/ML IV SOLN COMPARISON:  04/24/2018 CT abdomen/pelvis and abdominal sonogram. FINDINGS: Lower chest: No acute abnormality at the lung bases. Hepatobiliary: Normal liver size and configuration. No hepatic steatosis. No liver mass. Cholecystectomy. There is new mild diffuse intrahepatic biliary ductal dilatation. Dilated common bile duct with CBD diameter 11 mm, increased from 7 mm on 04/24/2018 CT. There is an  obstructing 5 mm choledocholithiasis at the ampulla. No additional filling defects in the bile ducts. No biliary strictures. Pancreas: No pancreatic mass or duct dilation.  No pancreas divisum. Spleen: Normal size. No mass. Adrenals/Urinary Tract: Normal adrenals. No hydronephrosis. No renal masses. Normal size kidneys. Limited visualization of known nonobstructing stone in the lower right kidney. Stomach/Bowel: Normal non-distended stomach. Visualized small and large bowel is normal caliber, with no bowel wall thickening. Vascular/Lymphatic: Normal caliber abdominal aorta. Patent portal, splenic, hepatic and renal veins. No pathologically enlarged  lymph nodes in the abdomen. Other: No abdominal ascites or focal fluid collection. Musculoskeletal: No aggressive appearing focal osseous lesions. IMPRESSION: 1. Obstructing 5 mm choledocholith at the ampulla. New diffuse intrahepatic biliary ductal dilatation. Dilated CBD (11 mm diameter), increased. 2. Nonobstructing lower right nephrolithiasis. These results will be called to the ordering clinician or representative by the Radiologist Assistant, and communication documented in the PACS or zVision Dashboard. Electronically Signed   By: Delbert Phenix M.D.   On: 04/29/2018 13:54    Joana Reamer, DO 04/30/2018, 12:38 PM PGY-1, [facility] FPTS Intern pager: [phone], text pages welcome

## 2018-04-30 ENCOUNTER — Other Ambulatory Visit: Payer: Self-pay

## 2018-04-30 ENCOUNTER — Encounter (HOSPITAL_COMMUNITY): Payer: Self-pay | Admitting: Gastroenterology

## 2018-04-30 LAB — CBC
HCT: 37.2 % (ref 36.0–46.0)
Hemoglobin: 12.2 g/dL (ref 12.0–15.0)
MCH: 31.3 pg (ref 26.0–34.0)
MCHC: 32.8 g/dL (ref 30.0–36.0)
MCV: 95.4 fL (ref 78.0–100.0)
Platelets: 242 10*3/uL (ref 150–400)
RBC: 3.9 MIL/uL (ref 3.87–5.11)
RDW: 12.2 % (ref 11.5–15.5)
WBC: 4.9 10*3/uL (ref 4.0–10.5)

## 2018-04-30 LAB — COMPREHENSIVE METABOLIC PANEL
ALK PHOS: 454 U/L — AB (ref 38–126)
ALT: 490 U/L — AB (ref 0–44)
AST: 153 U/L — AB (ref 15–41)
Albumin: 3.2 g/dL — ABNORMAL LOW (ref 3.5–5.0)
Anion gap: 4 — ABNORMAL LOW (ref 5–15)
BILIRUBIN TOTAL: 0.6 mg/dL (ref 0.3–1.2)
BUN: 5 mg/dL — ABNORMAL LOW (ref 6–20)
CALCIUM: 8.8 mg/dL — AB (ref 8.9–10.3)
CO2: 21 mmol/L — ABNORMAL LOW (ref 22–32)
CREATININE: 0.65 mg/dL (ref 0.44–1.00)
Chloride: 110 mmol/L (ref 98–111)
Glucose, Bld: 103 mg/dL — ABNORMAL HIGH (ref 70–99)
Potassium: 3 mmol/L — ABNORMAL LOW (ref 3.5–5.1)
Sodium: 135 mmol/L (ref 135–145)
TOTAL PROTEIN: 5.6 g/dL — AB (ref 6.5–8.1)

## 2018-04-30 LAB — TSH: TSH: 0.03 u[IU]/mL — AB (ref 0.350–4.500)

## 2018-04-30 LAB — HIV ANTIBODY (ROUTINE TESTING W REFLEX): HIV Screen 4th Generation wRfx: NONREACTIVE

## 2018-04-30 MED ORDER — FLUTICASONE PROPIONATE 50 MCG/ACT NA SUSP
1.0000 | Freq: Two times a day (BID) | NASAL | Status: DC | PRN
  Filled 2018-04-30: qty 16

## 2018-04-30 MED ORDER — POTASSIUM CHLORIDE CRYS ER 20 MEQ PO TBCR: 40.0000 meq | EXTENDED_RELEASE_TABLET | Freq: Once | ORAL | Status: DC

## 2018-04-30 MED ORDER — POTASSIUM CHLORIDE CRYS ER 20 MEQ PO TBCR
40.0000 meq | EXTENDED_RELEASE_TABLET | Freq: Two times a day (BID) | ORAL | Status: AC
  Administered 2018-04-30 (×2): 40 meq via ORAL
  Filled 2018-04-30 (×2): qty 2

## 2018-04-30 MED ORDER — HYDROMORPHONE HCL 1 MG/ML IJ SOLN
0.5000 mg | Freq: Once | INTRAMUSCULAR | Status: AC
  Administered 2018-04-30: 0.5 mg via INTRAVENOUS
  Filled 2018-04-30: qty 0.5

## 2018-04-30 MED ORDER — IBUPROFEN 200 MG PO TABS
600.0000 mg | ORAL_TABLET | Freq: Once | ORAL | Status: AC
  Administered 2018-04-30: 600 mg via ORAL
  Filled 2018-04-30: qty 3

## 2018-04-30 NOTE — Plan of Care (Signed)
Patient stable, discussed POC with patient, agreeable with plan, denies question/concerns at this time. Pain/nausea managed with PRN medications.

## 2018-04-30 NOTE — Discharge Summary (Signed)
Family Medicine Teaching [facility] Discharge Summary  Patient name: Jenna Navarro Medical record number: [id number] Date of birth: 12-07-1975 Age: 42 y.o. Gender: female Date of Admission: 04/29/2018  Date of Discharge: 05/01/2018 Admitting Physician: Carney Living, MD  Primary Care Provider: Patient, No Pcp Per Consultants: [facility]  Indication for Hospitalization: Epigastric and RUQ pain  Discharge Diagnoses/Problem List:  Obstructing cholodocholithiasis of the ampulla  Hypokalemia   Disposition: Home  Discharge Condition: Stable  Discharge Exam:  Physical Exam:  Gen: NAD, alert, non-toxic, well-appearing, up out of bed walking around room  Skin: Warm and dry. No obvious rashes, lesions, or trauma. HEENT: NCAT No conjunctival pallor or injection. No scleral icterus or injection.  MMM. Oropharynx clear without erythema or exudates CV: RRR. <2s capillary refill bilaterally.  RP & DPs 2+ bilaterally. No bilateral LE edema  Resp: CTAB.  No wheezing, rales, abnormal lung sounds.  No increased WOB Abd: NTND on palpation to all 4 quadrants.  Positive bowel sounds.  Psych: Cooperative with exam. Pleasant. Makes eye contact. Speech normal. Extremities: Moves all extremities spontaneously   Brief Hospital Course:  Jenna Navarro is a 42 y.o. female with past medical history significant for cholecystitis 2/2 to gangrenous gallbladder, bipolar disorder, hypothyroidism, and asthma, who presented with epigastric and RUQ pain s/p scheduled MRI by [facility] that found obstructing choledocholithiasis of the ampulla. Patient was hemodynamically stable and afebrile at presentation and throughout hospital stay with no leukocytosis. Pain was well controlled with Dilaudid. Patient had ERCP completed on 05/01/18 by [facility] without complication. She was monitored post-op for several hours and was stable and improved by discharge with close follow-up scheduled.     In addition  to the choledocholithiasis, a nonobstructive small staghorn calculus in the lower pole of the right kidney measuring 13mm was noted on CT and MRI. She had noted some discomfort from this.   Other hospital day findings included a TSH of 0.03. Her dose of Synthroid was not adjusted during her hospital stay.   Issues for Follow Up:  1. Per GI, follow liver tests back to normal 2. Consider outpatient evaluation with urology for nonobstructing right nephrolithiasis 3. Consider adjusting synthroid dosage given TSH.  Significant Procedures:  ERCP on 05/01/18  Significant Labs and Imaging:  Recent Labs  Lab 04/29/18 1521 04/30/18 0506 05/01/18 0347  WBC 7.3 4.9 4.1  HGB 13.8 12.2 12.6  HCT 42.5 37.2 39.5  PLT 324 242 237   Recent Labs  Lab 04/29/18 1521 04/30/18 0506 05/01/18 0347  NA 137 135 140  K 3.7 3.0* 3.6  CL 105 110 111  CO2 24 21* 21*  GLUCOSE 97 103* 105*  BUN 8 5* <5*  CREATININE 0.91 0.65 0.66  CALCIUM 10.1 8.8* 9.2  ALKPHOS 587* 454* 457*  AST 379* 153* 105*  ALT 690* 490* 395*  ALBUMIN 3.9 3.2* 3.3*   Ct Abdomen Pelvis W Contrast  Result Date: 04/24/2018 CLINICAL DATA:  Epigastric tightness with nausea vomiting for 2 days. EXAM: CT ABDOMEN AND PELVIS WITH CONTRAST TECHNIQUE: Multidetector CT imaging of the abdomen and pelvis was performed using the standard protocol following bolus administration of intravenous contrast. CONTRAST:  OMNIPAQUE IOHEXOL 300 MG/ML  SOLN COMPARISON:  None. FINDINGS: Lower chest: No acute abnormality. Hepatobiliary: Fatty steatosis. Previous cholecystectomy. No liver mass. Portal vein is patent. Pancreas: Unremarkable. No pancreatic ductal dilatation or surrounding inflammatory changes. Spleen: Normal in size without focal abnormality. Adrenals/Urinary Tract: There is a small staghorn  calculus in the lower pole of the right kidney measuring 13 mm. No other renal stones. No suspicious renal masses. No hydronephrosis or perinephric  stranding. No ureterectasis or ureteral stones. The adrenal glands are normal. The bladder is unremarkable. Stomach/Bowel: The stomach and small bowel are normal. The colon and appendix are normal. Vascular/Lymphatic: No significant vascular findings are present. No enlarged abdominal or pelvic lymph nodes. Reproductive: Probable small fibroid in the anterior uterus is seen on axial image 73. The left ovary is normal. There appears to be a dominant follicle in the right ovary measuring up to 2.3 cm. Other: No free air free fluid. Musculoskeletal: No acute or significant osseous findings. IMPRESSION: 1. No acute abnormality identified. 2. Small staghorn calculus measuring 13 mm in the lower pole the right kidney. No renal obstruction. 3. Probable small fibroid in the uterus. 4. Dominant follicle in the right ovary measuring 2.3 cm. 5. No other acute abnormalities. Electronically Signed   By: David  Williams III M.D   On: 04/24/2018 20:24   Us Abdomen Limited  Result Date: 04/24/2018 CLINICAL DATA:  Right upper quadrant abdominal pain. EXAM: ULTRASOUND ABDOMEN LIMITED RIGHT UPPER QUADRANT COMPARISON:  Radiographs of March 22, 2017. FINDINGS: Gallbladder: Status post cholecystectomy. Common bile duct: Diameter: 7.3 mm which is within normal limits given post cholecystectomy status Liver: No focal lesion identified. Within normal limits in parenchymal echogenicity. Portal vein is patent on color Doppler imaging with normal direction of blood flow towards the liver. IMPRESSION: Status post cholecystectomy. No other abnormality seen in the right upper quadrant of the abdomen. Electronically Signed   By: James  Green Jr, M.D.   On: 04/24/2018 18:56   Dg Ercp Biliary & Pancreatic Ducts  Result Date: 05/01/2018 CLINICAL DATA:  42 year old female with dilatation of the common bile duct EXAM: ERCP TECHNIQUE: Multiple spot images obtained with the fluoroscopic device and submitted for interpretation post-procedure.  FLUOROSCOPY TIME:  Fluoroscopy Time:  2 minutes 45 seconds COMPARISON:  None. FINDINGS: A total of 5 intraoperative spot images were obtained and submitted for review. The images demonstrate a flexible endoscope in the descending duodenum with wire cannulation of the common bile duct. Initial cholangiogram demonstrates a filling defect in the distal common bile duct consistent with choledocholithiasis. Subsequent images document sphincterotomy and balloon sweep of the common duct. The filling defect is no longer present on the final image. IMPRESSION: 1. Choledocholithiasis. 2. ERCP with sphincterotomy and balloon sweep of the common duct. These images were submitted for radiologic interpretation only. Please see the procedural report for the amount of contrast and the fluoroscopy time utilized. Electronically Signed   By: Heath  McCullough M.D.   On: 05/01/2018 11:13   Mr Abdomen Mrcp W Wo Contast  Result Date: 04/29/2018 CLINICAL DATA:  Severe right upper quadrant abdominal pain, nausea and vomiting. History of cholecystectomy in 2018. Abnormal liver function tests. EXAM: MRI ABDOMEN WITHOUT AND WITH CONTRAST (INCLUDING MRCP) TECHNIQUE: Multiplanar multisequence MR imaging of the abdomen was performed both before and after the administration of intravenous contrast. Heavily T2-weighted images of the biliary and pancreatic ducts were obtained, and three-dimensional MRCP images were rendered by post processing. CONTRAST:  [id number] MULTIHANCE GADOBENATE DIMEGLUMINE 529 MG/ML IV SOLN COMPARISON:  04/24/2018 CT abdomen/pelvis and abdominal sonogram. FINDINGS: Lower chest: No acute abnormality at the lung bases. Hepatobiliary: Normal liver size and configuration. No hepatic steatosis. No liver mass. Cholecystectomy. There is new mild diffuse intrahepatic biliary ductal dilatation. Dilated common bile duct with CBD diameter 11 mm,  increased from 7 mm on 04/24/2018 CT. There is an obstructing 5 mm choledocholithiasis at  the ampulla. No additional filling defects in the bile ducts. No biliary strictures. Pancreas: No pancreatic mass or duct dilation.  No pancreas divisum. Spleen: Normal size. No mass. Adrenals/Urinary Tract: Normal adrenals. No hydronephrosis. No renal masses. Normal size kidneys. Limited visualization of known nonobstructing stone in the lower right kidney. Stomach/Bowel: Normal non-distended stomach. Visualized small and large bowel is normal caliber, with no bowel wall thickening. Vascular/Lymphatic: Normal caliber abdominal aorta. Patent portal, splenic, hepatic and renal veins. No pathologically enlarged lymph nodes in the abdomen. Other: No abdominal ascites or focal fluid collection. Musculoskeletal: No aggressive appearing focal osseous lesions. IMPRESSION: 1. Obstructing 5 mm choledocholith at the ampulla. New diffuse intrahepatic biliary ductal dilatation. Dilated CBD (11 mm diameter), increased. 2. Nonobstructing lower right nephrolithiasis. These results will be called to the ordering clinician or representative by the Radiologist Assistant, and communication documented in the PACS or zVision Dashboard. Electronically Signed   By: Delbert Phenix M.D.   On: 04/29/2018 13:54   Results/Tests Pending at Time of Discharge: None  Discharge Medications:  Allergies as of 05/01/2018      Reactions   Codeine Shortness Of Breath, Palpitations, Other (See Comments)   Rapid heart rate and sweating also   Latex Hives      Medication List    STOP taking these medications   acetaminophen 500 MG tablet Commonly known as:  TYLENOL   ibuprofen 200 MG tablet Commonly known as:  ADVIL,MOTRIN   oxyCODONE 5 MG immediate release tablet Commonly known as:  Oxy IR/ROXICODONE     TAKE these medications   acyclovir ointment 5 % Commonly known as:  ZOVIRAX Apply 1 application topically See admin instructions. Apply to affected areas every 4 hours as needed for fever blisters- for up to 5 days   ADVAIR  DISKUS 250-50 MCG/DOSE Aepb Generic drug:  Fluticasone-Salmeterol Inhale 1 puff into the lungs 2 (two) times daily.   albuterol 108 (90 Base) MCG/ACT inhaler Commonly known as:  PROVENTIL HFA;VENTOLIN HFA Inhale 1-2 puffs into the lungs every 6 (six) hours as needed for wheezing or shortness of breath.   ALPRAZolam 0.5 MG tablet Commonly known as:  XANAX Take 0.25-0.5 mg by mouth daily as needed for anxiety.   ARIPiprazole 10 MG tablet Commonly known as:  ABILIFY Take 20 mg by mouth at bedtime.   cetirizine 10 MG tablet Commonly known as:  ZYRTEC Take 10 mg by mouth daily as needed for allergies.   DECARA 16109 units capsule Generic drug:  Cholecalciferol Take 50,000 Units by mouth every Sunday.   EQUETRO 200 MG Cp12 12 hr capsule Generic drug:  carbamazepine Take 600 mg by mouth at bedtime.   Fluocinonide 0.1 % Crea Apply 1 application topically daily as needed (for rashes on arms).   fluticasone 50 MCG/ACT nasal spray Commonly known as:  FLONASE Place 1-2 sprays into both nostrils 2 (two) times daily as needed for allergies or rhinitis (for seasonal allergies).   hydrochlorothiazide 25 MG tablet Commonly known as:  HYDRODIURIL Take 25 mg by mouth every morning.   lamoTRIgine 200 MG tablet Commonly known as:  LAMICTAL Take 400 mg by mouth at bedtime.   levothyroxine 300 MCG tablet Commonly known as:  SYNTHROID, LEVOTHROID Take 300 mcg by mouth daily before breakfast.   lithium carbonate 450 MG CR tablet Commonly known as:  ESKALITH Take 900 mg by mouth at bedtime.   minocycline  50 MG capsule Commonly known as:  MINOCIN,DYNACIN Take 50 mg by mouth 2 (two) times daily.   montelukast 10 MG tablet Commonly known as:  SINGULAIR Take 10 mg by mouth daily.   ondansetron 4 MG disintegrating tablet Commonly known as:  ZOFRAN-ODT Take 1 tablet (4 mg total) by mouth every 8 (eight) hours as needed for nausea or vomiting.   valACYclovir 1000 MG tablet Commonly  known as:  VALTREX Take 2,000 mg by mouth 2 (two) times daily as needed (for fever blisters- for 1 day, as directed).      Discharge Instructions: Please refer to Patient Instructions section of EMR for full details.  Patient was counseled important signs and symptoms that should prompt return to medical care, changes in medications, dietary instructions, activity restrictions, and follow up appointments.   Follow-Up Appointments: Follow-up Information    Vida Rigger, MD. Schedule an appointment as soon as possible for a visit in 1 week(s).   Specialty:  Gastroenterology Why:  for follow-up and labs Contact information: [address] [place] [address] [phone]        Alain Honey, MD Follow up on 05/11/2018.   Specialty:  Family Medicine Why:  at 12:40pm Contact information: [address] [place] [address] [phone]           Joana Reamer, DO 05/02/2018, 2:53 PM PGY-1, [facility]

## 2018-04-30 NOTE — Consult Note (Signed)
Reason for Consult: CBD stones Referring Physician: Hospital team  Jenna Navarro is an 42 y.o. female.  HPI: Patient seen in the office recently by my partner Dr. Raliegh Ip in her hospital computer chart was reviewed and we discussed her cholecystectomy a year ago which had gangrene and no IOC was done and her liver tests were only minimally elevated then and she is had pain which comes and goes lately have been fine in between and has no other complaints Case discussed with the hospital team as well  Past Medical History:  Diagnosis Date  . Asthma   . Bipolar 1 disorder ([place])   . Meniere's disease   . Thyroid disease    hashimoto     Past Surgical History:  Procedure Laterality Date  . CHOLECYSTECTOMY N/A 03/23/2017   Procedure: LAPAROSCOPIC CHOLECYSTECTOMY, W/ POSS IOC;  Surgeon: Erroll Luna, MD;  Location: [facility];  Service: General;  Laterality: N/A;    History reviewed. No pertinent family history.  Social History:  reports that she has never smoked. She has never used smokeless tobacco. She reports that she does not drink alcohol or use drugs.  Allergies:  Allergies  Allergen Reactions  . Codeine Shortness Of Breath, Palpitations and Other (See Comments)    Rapid heart rate and sweating also   . Latex Hives    Medications: I have reviewed the patient's current medications.  Results for orders placed or performed during the hospital encounter of 04/29/18 (from the past 48 hour(s))  CBC with Differential/Platelet     Status: None   Collection Time: 04/29/18  3:21 PM  Result Value Ref Range   WBC 7.3 4.0 - 10.5 K/uL   RBC 4.42 3.87 - 5.11 MIL/uL   Hemoglobin 13.8 12.0 - 15.0 g/dL   HCT 42.5 36.0 - 46.0 %   MCV 96.2 78.0 - 100.0 fL   MCH 31.2 26.0 - 34.0 pg   MCHC 32.5 30.0 - 36.0 g/dL   RDW 12.3 11.5 - 15.5 %   Platelets 324 150 - 400 K/uL   Neutrophils Relative % 70 %   Neutro Abs 5.0 1.7 - 7.7 K/uL   Lymphocytes Relative 23 %   Lymphs Abs 1.7 0.7 - 4.0 K/uL    Monocytes Relative 7 %   Monocytes Absolute 0.5 0.1 - 1.0 K/uL   Eosinophils Relative 0 %   Eosinophils Absolute 0.0 0.0 - 0.7 K/uL   Basophils Relative 0 %   Basophils Absolute 0.0 0.0 - 0.1 K/uL   Immature Granulocytes 0 %   Abs Immature Granulocytes 0.0 0.0 - 0.1 K/uL    Comment: Performed at [facility], [address], [place], [place] [address]  Comprehensive metabolic panel     Status: Abnormal   Collection Time: 04/29/18  3:21 PM  Result Value Ref Range   Sodium 137 135 - 145 mmol/L   Potassium 3.7 3.5 - 5.1 mmol/L   Chloride 105 98 - 111 mmol/L   CO2 24 22 - 32 mmol/L   Glucose, Bld 97 70 - 99 mg/dL   BUN 8 6 - 20 mg/dL   Creatinine, Ser 0.91 0.44 - 1.00 mg/dL   Calcium 10.1 8.9 - 10.3 mg/dL   Total Protein 6.7 6.5 - 8.1 g/dL   Albumin 3.9 3.5 - 5.0 g/dL   AST 379 (H) 15 - 41 U/L   ALT 690 (H) 0 - 44 U/L   Alkaline Phosphatase 587 (H) 38 - 126 U/L   Total  Bilirubin 1.1 0.3 - 1.2 mg/dL   GFR calc non Af Amer >60 >60 mL/min   GFR calc Af Amer >60 >60 mL/min    Comment: (NOTE) The eGFR has been calculated using the CKD EPI equation. This calculation has not been validated in all clinical situations. eGFR's persistently <60 mL/min signify possible Chronic Kidney Disease.    Anion gap 8 5 - 15    Comment: Performed at [facility] [address], [place], [place] [address]  Lipase, blood     Status: None   Collection Time: 04/29/18  3:21 PM  Result Value Ref Range   Lipase 27 11 - 51 U/L    Comment: Performed at [facility] [address], [place], [place] [address]  Urinalysis, Routine w reflex microscopic     Status: None   Collection Time: 04/29/18 10:43 PM  Result Value Ref Range   Color, Urine YELLOW YELLOW   APPearance CLEAR CLEAR   Specific Gravity, Urine 1.017 1.005 - 1.030   pH 6.0 5.0 - 8.0   Glucose, UA NEGATIVE NEGATIVE mg/dL   Hgb urine dipstick NEGATIVE NEGATIVE   Bilirubin Urine NEGATIVE NEGATIVE   Ketones, ur NEGATIVE  NEGATIVE mg/dL   Protein, ur NEGATIVE NEGATIVE mg/dL   Nitrite NEGATIVE NEGATIVE   Leukocytes, UA NEGATIVE NEGATIVE    Comment: Performed at [facility] [address], [place], [place] [address]  TSH     Status: Abnormal   Collection Time: 04/30/18  5:06 AM  Result Value Ref Range   TSH 0.030 (L) 0.350 - 4.500 uIU/mL    Comment: Performed by a 3rd Generation assay with a functional sensitivity of <=0.01 uIU/mL. Performed at [facility], [place] [address], [place], [place] [address]   Comprehensive metabolic panel     Status: Abnormal   Collection Time: 04/30/18  5:06 AM  Result Value Ref Range   Sodium 135 135 - 145 mmol/L   Potassium 3.0 (L) 3.5 - 5.1 mmol/L    Comment: DELTA CHECK NOTED   Chloride 110 98 - 111 mmol/L   CO2 21 (L) 22 - 32 mmol/L   Glucose, Bld 103 (H) 70 - 99 mg/dL   BUN 5 (L) 6 - 20 mg/dL   Creatinine, Ser 0.65 0.44 - 1.00 mg/dL   Calcium 8.8 (L) 8.9 - 10.3 mg/dL   Total Protein 5.6 (L) 6.5 - 8.1 g/dL   Albumin 3.2 (L) 3.5 - 5.0 g/dL   AST 153 (H) 15 - 41 U/L   ALT 490 (H) 0 - 44 U/L   Alkaline Phosphatase 454 (H) 38 - 126 U/L   Total Bilirubin 0.6 0.3 - 1.2 mg/dL   GFR calc non Af Amer >60 >60 mL/min   GFR calc Af Amer >60 >60 mL/min    Comment: (NOTE) The eGFR has been calculated using the CKD EPI equation. This calculation has not been validated in all clinical situations. eGFR's persistently <60 mL/min signify possible Chronic Kidney Disease.    Anion gap 4 (L) 5 - 15    Comment: Performed at [facility] [address] [place] [address]  CBC     Status: None   Collection Time: 04/30/18  5:06 AM  Result Value Ref Range   WBC 4.9 4.0 - 10.5 K/uL   RBC 3.90 3.87 - 5.11 MIL/uL   Hemoglobin 12.2 12.0 - 15.0 g/dL   HCT 37.2 36.0 - 46.0 %   MCV  95.4 78.0 - 100.0 fL   MCH 31.3 26.0 - 34.0 pg   MCHC 32.8 30.0 - 36.0 g/dL   RDW 12.2 11.5 - 15.5 %   Platelets 242 150 - 400 K/uL    Comment: Performed at [facility], [place] [address], [place], [place] [address]    Mr Abdomen Mrcp Moise Boring Contast  Result Date: 04/29/2018 CLINICAL DATA:  Severe right upper quadrant abdominal pain, nausea and vomiting. History of cholecystectomy in 2018. Abnormal liver function tests. EXAM: MRI ABDOMEN WITHOUT AND WITH CONTRAST (INCLUDING MRCP) TECHNIQUE: Multiplanar multisequence MR imaging of the abdomen was performed both before and after the administration of intravenous contrast. Heavily T2-weighted images of the biliary and pancreatic ducts were obtained, and three-dimensional MRCP images were rendered by post processing. CONTRAST:  54m MULTIHANCE GADOBENATE DIMEGLUMINE 529 MG/ML IV SOLN COMPARISON:  04/24/2018 CT abdomen/pelvis and abdominal sonogram. FINDINGS: Lower chest: No acute abnormality at the lung bases. Hepatobiliary: Normal liver size and configuration. No hepatic steatosis. No liver mass. Cholecystectomy. There is new mild diffuse intrahepatic biliary ductal dilatation. Dilated common bile duct with CBD diameter 11 mm, increased from 7 mm on 04/24/2018 CT. There is an obstructing 5 mm choledocholithiasis at the ampulla. No additional filling defects in the bile ducts. No biliary strictures. Pancreas: No pancreatic mass or duct dilation.  No pancreas divisum. Spleen: Normal size. No mass. Adrenals/Urinary Tract: Normal adrenals. No hydronephrosis. No renal masses. Normal size kidneys. Limited visualization of known nonobstructing stone in the lower right kidney. Stomach/Bowel: Normal non-distended stomach. Visualized small and large bowel is normal caliber, with no bowel wall thickening. Vascular/Lymphatic: Normal caliber abdominal aorta. Patent portal, splenic, hepatic and renal veins. No pathologically enlarged lymph nodes in the abdomen. Other: No abdominal ascites or focal fluid collection. Musculoskeletal: No aggressive appearing focal osseous lesions. IMPRESSION: 1. Obstructing 5 mm choledocholith at the ampulla. New  diffuse intrahepatic biliary ductal dilatation. Dilated CBD (11 mm diameter), increased. 2. Nonobstructing lower right nephrolithiasis. These results will be called to the ordering clinician or representative by the Radiologist Assistant, and communication documented in the PACS or zVision Dashboard. Electronically Signed   By: JIlona SorrelM.D.   On: 04/29/2018 13:54    ROS negative except above Blood pressure 106/61, pulse 70, temperature 98.2 F (36.8 C), temperature source Oral, resp. rate 20, height '5\' 6"'$  (1.676 m), weight 91.9 kg, last menstrual period 04/24/2018, SpO2 100 %. Physical Exam no acute distress vital signs stable afebrile exam pertinent for some mild right upper quadrant discomfort liver tests increased MRCP CT and ultrasound reviewed  Assessment/Plan: CBD stones Plan: The risks benefits methods and success rate of ERCP was discussed with the patient and based on scheduling issues we will proceed tomorrow at 930 with further work-up and plans pending those findings  MYorkE 04/30/2018, 8:46 AM

## 2018-04-30 NOTE — Care Management Note (Addendum)
Case Management Note  Patient Details  Name: Jenna Navarro MRN: [id number] Date of Birth: 18-Oct-1975  Subjective/Objective:     Pt admitted with Choledocholitiasis. She is from home with mother.  PCP: Dr Alain Honey Pt denies issues with transportation and medications.  Pt denies any DME at home.               Action/Plan: Plan is for patient to return home when medically ready. CM following for d/c needs.   Expected Discharge Date:  05/03/18               Expected Discharge Plan:  Home/Self Care  In-House Referral:     Discharge planning Services     Post Acute Care Choice:    Choice offered to:     DME Arranged:    DME Agency:     HH Arranged:    HH Agency:     Status of Service:  In process, will continue to follow  If discussed at Long Length of Stay Meetings, dates discussed:    Additional Comments:  Kermit Balo, RN 04/30/2018, 2:27 PM

## 2018-05-01 ENCOUNTER — Inpatient Hospital Stay (HOSPITAL_COMMUNITY): Payer: BLUE CROSS/BLUE SHIELD

## 2018-05-01 ENCOUNTER — Inpatient Hospital Stay (HOSPITAL_COMMUNITY): Payer: BLUE CROSS/BLUE SHIELD | Admitting: Certified Registered Nurse Anesthetist

## 2018-05-01 ENCOUNTER — Encounter (HOSPITAL_COMMUNITY)
Admission: EM | Disposition: A | Discharge status: Home / Self Care | Payer: Self-pay | Source: Home / Self Care | Attending: Family Medicine

## 2018-05-01 ENCOUNTER — Encounter (HOSPITAL_COMMUNITY): Payer: Self-pay | Admitting: Certified Registered Nurse Anesthetist

## 2018-05-01 HISTORY — PX: SPHINCTEROTOMY: SHX5544

## 2018-05-01 HISTORY — PX: REMOVAL OF STONES: SHX5545

## 2018-05-01 HISTORY — PX: ERCP: SHX5425

## 2018-05-01 LAB — COMPREHENSIVE METABOLIC PANEL
ALT: 395 U/L — AB (ref 0–44)
AST: 105 U/L — ABNORMAL HIGH (ref 15–41)
Albumin: 3.3 g/dL — ABNORMAL LOW (ref 3.5–5.0)
Alkaline Phosphatase: 457 U/L — ABNORMAL HIGH (ref 38–126)
Anion gap: 8 (ref 5–15)
BUN: <5 mg/dL — ABNORMAL LOW (ref 6–20)
CHLORIDE: 111 mmol/L (ref 98–111)
CO2: 21 mmol/L — AB (ref 22–32)
CREATININE: 0.66 mg/dL (ref 0.44–1.00)
Calcium: 9.2 mg/dL (ref 8.9–10.3)
GFR calc non Af Amer: >60 mL/min (ref >60)
Glucose, Bld: 105 mg/dL — ABNORMAL HIGH (ref 70–99)
POTASSIUM: 3.6 mmol/L (ref 3.5–5.1)
Sodium: 140 mmol/L (ref 135–145)
Total Bilirubin: 0.6 mg/dL (ref 0.3–1.2)
Total Protein: 5.8 g/dL — ABNORMAL LOW (ref 6.5–8.1)

## 2018-05-01 LAB — CBC WITH DIFFERENTIAL/PLATELET
ABS IMMATURE GRANULOCYTES: 0 10*3/uL (ref 0.0–0.1)
BASOS ABS: 0 10*3/uL (ref 0.0–0.1)
BASOS PCT: 1 %
Eosinophils Absolute: 0 10*3/uL (ref 0.0–0.7)
Eosinophils Relative: 0 %
HCT: 39.5 % (ref 36.0–46.0)
HEMOGLOBIN: 12.6 g/dL (ref 12.0–15.0)
IMMATURE GRANULOCYTES: 1 %
LYMPHS PCT: 37 %
Lymphs Abs: 1.5 10*3/uL (ref 0.7–4.0)
MCH: 31 pg (ref 26.0–34.0)
MCHC: 31.9 g/dL (ref 30.0–36.0)
MCV: 97.1 fL (ref 78.0–100.0)
MONO ABS: 0.3 10*3/uL (ref 0.1–1.0)
Monocytes Relative: 6 %
NEUTROS ABS: 2.2 10*3/uL (ref 1.7–7.7)
NEUTROS PCT: 55 %
PLATELETS: 237 10*3/uL (ref 150–400)
RBC: 4.07 MIL/uL (ref 3.87–5.11)
RDW: 12.2 % (ref 11.5–15.5)
WBC: 4.1 10*3/uL (ref 4.0–10.5)

## 2018-05-01 LAB — URINALYSIS, ROUTINE W REFLEX MICROSCOPIC
BILIRUBIN URINE: NEGATIVE
Glucose, UA: NEGATIVE mg/dL
Hgb urine dipstick: NEGATIVE
KETONES UR: NEGATIVE mg/dL
LEUKOCYTES UA: NEGATIVE
NITRITE: NEGATIVE
PROTEIN: NEGATIVE mg/dL
Specific Gravity, Urine: 1.006 (ref 1.005–1.030)
pH: 7 (ref 5.0–8.0)

## 2018-05-01 LAB — SURGICAL PCR SCREEN
MRSA, PCR: NEGATIVE
STAPHYLOCOCCUS AUREUS: NEGATIVE

## 2018-05-01 LAB — PROTIME-INR
INR: 1.11
Prothrombin Time: 14.2 seconds (ref 11.4–15.2)

## 2018-05-01 SURGERY — ERCP, WITH INTERVENTION IF INDICATED
Anesthesia: General

## 2018-05-01 MED ORDER — SUCCINYLCHOLINE CHLORIDE 200 MG/10ML IV SOSY
PREFILLED_SYRINGE | INTRAVENOUS | Status: DC | PRN
  Administered 2018-05-01: 100 mg via INTRAVENOUS

## 2018-05-01 MED ORDER — FENTANYL CITRATE (PF) 100 MCG/2ML IJ SOLN
INTRAMUSCULAR | Status: AC
  Filled 2018-05-01: qty 2

## 2018-05-01 MED ORDER — CIPROFLOXACIN IN D5W 400 MG/200ML IV SOLN
400.0000 mg | Freq: Once | INTRAVENOUS | Status: AC
  Administered 2018-05-01: 400 mg via INTRAVENOUS

## 2018-05-01 MED ORDER — DEXAMETHASONE SODIUM PHOSPHATE 10 MG/ML IJ SOLN
INTRAMUSCULAR | Status: DC | PRN
  Administered 2018-05-01: 10 mg via INTRAVENOUS

## 2018-05-01 MED ORDER — INDOMETHACIN 50 MG RE SUPP
RECTAL | Status: AC
  Filled 2018-05-01: qty 2

## 2018-05-01 MED ORDER — CIPROFLOXACIN IN D5W 400 MG/200ML IV SOLN
INTRAVENOUS | Status: AC
  Filled 2018-05-01: qty 200

## 2018-05-01 MED ORDER — IOPAMIDOL (ISOVUE-300) INJECTION 61%
INTRAVENOUS | Status: AC
  Filled 2018-05-01: qty 50

## 2018-05-01 MED ORDER — SODIUM CHLORIDE 0.9 % IV SOLN
INTRAVENOUS | Status: DC | PRN
  Administered 2018-05-01: 25 mL

## 2018-05-01 MED ORDER — FENTANYL CITRATE (PF) 100 MCG/2ML IJ SOLN
25.0000 ug | Freq: Once | INTRAMUSCULAR | Status: AC
  Administered 2018-05-01: 25 ug via INTRAVENOUS

## 2018-05-01 MED ORDER — FENTANYL CITRATE (PF) 100 MCG/2ML IJ SOLN
INTRAMUSCULAR | Status: DC | PRN
  Administered 2018-05-01: 25 ug via INTRAVENOUS
  Administered 2018-05-01: 50 ug via INTRAVENOUS
  Administered 2018-05-01: 25 ug via INTRAVENOUS

## 2018-05-01 MED ORDER — LIDOCAINE 2% (20 MG/ML) 5 ML SYRINGE
INTRAMUSCULAR | Status: DC | PRN
  Administered 2018-05-01: 80 mg via INTRAVENOUS

## 2018-05-01 MED ORDER — GLUCAGON HCL RDNA (DIAGNOSTIC) 1 MG IJ SOLR
INTRAMUSCULAR | Status: AC
  Filled 2018-05-01: qty 1

## 2018-05-01 MED ORDER — SODIUM CHLORIDE 0.9 % IV SOLN: INTRAVENOUS | Status: DC

## 2018-05-01 MED ORDER — ONDANSETRON HCL 4 MG/2ML IJ SOLN
INTRAMUSCULAR | Status: DC | PRN
  Administered 2018-05-01: 4 mg via INTRAVENOUS

## 2018-05-01 MED ORDER — MIDAZOLAM HCL 2 MG/2ML IJ SOLN
INTRAMUSCULAR | Status: DC | PRN
  Administered 2018-05-01: 2 mg via INTRAVENOUS

## 2018-05-01 MED ORDER — PROPOFOL 10 MG/ML IV BOLUS
INTRAVENOUS | Status: DC | PRN
  Administered 2018-05-01: 200 mg via INTRAVENOUS

## 2018-05-01 NOTE — Progress Notes (Signed)
Patient left unit for procedure at this time.   Sim Boast,  RN

## 2018-05-01 NOTE — Anesthesia Procedure Notes (Signed)
Procedure Name: Intubation Date/Time: 05/01/2018 10:05 AM Performed by: Pearson Grippe, CRNA Pre-anesthesia Checklist: Patient identified, Emergency Drugs available, Suction available and Patient being monitored Patient Re-evaluated:Patient Re-evaluated prior to induction Oxygen Delivery Method: Circle system utilized Preoxygenation: Pre-oxygenation with 100% oxygen Induction Type: IV induction Ventilation: Mask ventilation without difficulty Laryngoscope Size: Miller and 2 Grade View: Grade I Tube type: Oral Tube size: 7.0 mm Number of attempts: 1 Airway Equipment and Method: Stylet Placement Confirmation: ETT inserted through vocal cords under direct vision,  positive ETCO2 and breath sounds checked- equal and bilateral Secured at: 20 cm Tube secured with: Tape Dental Injury: Teeth and Oropharynx as per pre-operative assessment

## 2018-05-01 NOTE — Progress Notes (Signed)
Patient returned to room from procedure. VSS. No other distress concern. Will continue to monitor.  Sim Boast, RN

## 2018-05-01 NOTE — Op Note (Signed)
[facility] Patient Name: Jenna Navarro Procedure Date : 05/01/2018 MRN: [id number] Attending MD: Vida Rigger , MD Date of Birth: 02/14/1976 CSN: [id number] Age: 42 Admit Type: Inpatient Procedure:                ERCP Indications:              Bile duct stone on MRCP right upper quadrant pain                            or tenderness Providers:                Vida Rigger, MD, Zoe Lan, RN, Beryle Beams,                            Technician Referring MD:              Medicines:                General Anesthesia Complications:            No immediate complications. Estimated Blood Loss:     Estimated blood loss: none. Procedure:                Pre-Anesthesia Assessment:                           - Prior to the procedure, a History and Physical                            was performed, and patient medications and                            allergies were reviewed. The patient's tolerance of                            previous anesthesia was also reviewed. The risks                            and benefits of the procedure and the sedation                            options and risks were discussed with the patient.                            All questions were answered, and informed consent                            was obtained. Prior Anticoagulants: The patient has                            taken no previous anticoagulant or antiplatelet                            agents. ASA Grade Assessment: II - A patient with                            mild  systemic disease. After reviewing the risks                            and benefits, the patient was deemed in                            satisfactory condition to undergo the procedure.                           After obtaining informed consent, the scope was                            passed under direct vision. Throughout the                            procedure, the patient's blood pressure, pulse, and           oxygen saturations were monitored continuously. The                            [id number]-[id number] ([id number]) Olympus ERCP was introduced                            through the mouth, and used to inject contrast into                            and used to cannulate the bile duct. The ERCP was                            accomplished without difficulty. The patient                            tolerated the procedure well. Scope In: Scope Out: Findings:      The major papilla was normal. Deep selective cannulation is readily       obtained and there was no pancreatic duct injection or wire advancement       and an obvious stone was seen on initial cholangiogram we proceeded with       a biliary sphincterotomy which was made with a Hydratome sphincterotome       using ERBE electrocautery. There was no post-sphincterotomy bleeding.       The biliary tree was swept with an adjustable 9- 12 mm balloon starting       at the bifurcation, left main hepatic duct and right main hepatic ducts.       One stone was removed. We swept the upper ducts with the 9 mm balloon       CBD with 12 mm balloon balloons passed through the patent sphincterotomy       site and we proceeded with an occlusion cholangiogram in the customary       fashion and no stones remained. There was excellent drainage and the       patient tolerated the procedure well Impression:               - The major papilla appeared normal.                           -  Choledocholithiasis was found. Complete removal                            was accomplished by biliary sphincterotomy and                            balloon extraction.                           - A biliary sphincterotomy was performed.                           - The biliary tree was swept. There is no                            pancreatic duct injection or wire advancement Moderate Sedation:      moderate sedation-none Recommendation:           Follow liver tests back to normal  which can be done                            as an outpatient- Clear liquid diet for 6 hours.                           - Continue present medications.                           - Return to GI clinic PRN.                           - Telephone GI clinic if symptomatic PRN. Procedure Code(s):        --- Professional ---                           [phone], Endoscopic retrograde                            cholangiopancreatography (ERCP); with removal of                            calculi/debris from biliary/pancreatic duct(s)                           43262, Endoscopic retrograde                            cholangiopancreatography (ERCP); with                            sphincterotomy/papillotomy Diagnosis Code(s):        --- Professional ---                           K80.50, Calculus of bile duct without cholangitis                            or cholecystitis without obstruction CPT copyright 2017 American  Medical Association. All rights reserved. The codes documented in this report are preliminary and upon coder review may  be revised to meet current compliance requirements. Vida Rigger, MD 05/01/2018 10:46:04 AM This report has been signed electronically. Number of Addenda: 0

## 2018-05-01 NOTE — Discharge Instructions (Signed)
Dear Jenna Navarro,   Thank you for letting us participate in your care! In this section, you will find a brief hospital admission summary of why you were admitted to the hospital, what happened, and any further follow up we think would benefit you and your health:   You were admitted because you were experiencing severe abdominal pain. You were found to have a stone blocking the duct to your pancrease (obstructing cholodocolithiasis of the ampulla). You had an ERCP performed to remove the stone. You tolerated this procedure very well.   POST-HOSPITAL/FOLLOW-UP CARE INSTRUCTIONS Home instructions:  1. Please be sure to follow up with gastroenterologist Dr. Ewing Schlein. Please call his office to make an appointment for 1 week from now. 2. Please also be sure to follow-up with your PCP at your scheduled appointment.   Thank you for choosing [facility]! Take care and be well!  Family Medicine Teaching Service Inpatient Team [place]  [facility]  [address] [place], [place] [address] [phone]

## 2018-05-01 NOTE — Anesthesia Postprocedure Evaluation (Signed)
Anesthesia Post Note  Patient: Lawrence Marseilles  Procedure(s) Performed: ENDOSCOPIC RETROGRADE CHOLANGIOPANCREATOGRAPHY (ERCP) (N/A ) SPHINCTEROTOMY REMOVAL OF STONES     Patient location during evaluation: PACU Anesthesia Type: General Level of consciousness: awake and alert Pain management: pain level controlled Vital Signs Assessment: post-procedure vital signs reviewed and stable Respiratory status: spontaneous breathing, nonlabored ventilation, respiratory function stable and patient connected to nasal cannula oxygen Cardiovascular status: blood pressure returned to baseline and stable Postop Assessment: no apparent nausea or vomiting Anesthetic complications: no    Last Vitals:  Vitals:   05/01/18 1105 05/01/18 1128  BP: (!) 123/57 (!) 105/48  Pulse: 71 (!) 58  Resp: 17 18  Temp:  36.6 C  SpO2: 100% 100%    Last Pain:  Vitals:   05/01/18 1128  TempSrc: Oral  PainSc: 0-No pain                 Kennieth Rad

## 2018-05-01 NOTE — Anesthesia Preprocedure Evaluation (Signed)
Anesthesia Evaluation  Patient identified by MRN, date of birth, ID band Patient awake    Reviewed: NPO status , Patient's Chart, lab work & pertinent test results  Airway Mallampati: II  TM Distance: >3 FB Neck ROM: Full    Dental  (+) Dental Advisory Given   Pulmonary asthma ,    breath sounds clear to auscultation       Cardiovascular negative cardio ROS   Rhythm:Regular Rate:Normal     Neuro/Psych Bipolar Disorder negative neurological ROS     GI/Hepatic negative GI ROS, Neg liver ROS,   Endo/Other  Hypothyroidism   Renal/GU negative Renal ROS     Musculoskeletal   Abdominal   Peds  Hematology negative hematology ROS (+)   Anesthesia Other Findings   Reproductive/Obstetrics                             Lab Results  Component Value Date   WBC 4.1 05/01/2018   HGB 12.6 05/01/2018   HCT 39.5 05/01/2018   MCV 97.1 05/01/2018   PLT 237 05/01/2018   Lab Results  Component Value Date   CREATININE 0.66 05/01/2018   BUN <5 (L) 05/01/2018   NA 140 05/01/2018   K 3.6 05/01/2018   CL 111 05/01/2018   CO2 21 (L) 05/01/2018    Anesthesia Physical Anesthesia Plan  ASA: II  Anesthesia Plan: General   Post-op Pain Management:    Induction: Intravenous  PONV Risk Score and Plan: 3 and Dexamethasone, Ondansetron, Treatment may vary due to age or medical condition and Midazolam  Airway Management Planned: Oral ETT  Additional Equipment:   Intra-op Plan:   Post-operative Plan: Extubation in OR  Informed Consent: I have reviewed the patients History and Physical, chart, labs and discussed the procedure including the risks, benefits and alternatives for the proposed anesthesia with the patient or authorized representative who has indicated his/her understanding and acceptance.   Dental advisory given  Plan Discussed with: CRNA  Anesthesia Plan Comments:          Anesthesia Quick Evaluation

## 2018-05-01 NOTE — Transfer of Care (Signed)
Immediate Anesthesia Transfer of Care Note  Patient: Jenna Navarro  Procedure(s) Performed: ENDOSCOPIC RETROGRADE CHOLANGIOPANCREATOGRAPHY (ERCP) (N/A ) SPHINCTEROTOMY REMOVAL OF STONES  Patient Location: Endoscopy Unit  Anesthesia Type:General  Level of Consciousness: drowsy and patient cooperative  Airway & Oxygen Therapy: Patient Spontanous Breathing and Patient connected to face mask oxygen  Post-op Assessment: Report given to RN and Post -op Vital signs reviewed and stable  Post vital signs: Reviewed and stable  Last Vitals:  Vitals Value Taken Time  BP 119/63 05/01/2018 10:45 AM  Temp 36.9 C 05/01/2018 10:45 AM  Pulse 75 05/01/2018 10:50 AM  Resp 17 05/01/2018 10:50 AM  SpO2 98 % 05/01/2018 10:50 AM  Vitals shown include unvalidated device data.  Last Pain:  Vitals:   05/01/18 1045  TempSrc: Oral  PainSc: 0-No pain         Complications: No apparent anesthesia complications

## 2018-05-01 NOTE — Progress Notes (Signed)
NURSING PROGRESS NOTE  AEDYN MCKEON [id number] Discharge Data: 05/01/2018 7:03 PM Attending Provider: No att. providers found [id number], No Pcp Per     Lawrence Marseilles to be D/C'd Home per MD order.  Discussed with the patient the After Visit Summary and all questions fully answered. All IV's discontinued with no bleeding noted. All belongings returned to patient for patient to take home.   Last Vital Signs:  Blood pressure (!) 105/48, pulse (!) 58, temperature 97.9 F (36.6 C), temperature source Oral, resp. rate 18, height 5\' 6"  (1.676 m), weight 91.9 kg, last menstrual period 04/24/2018, SpO2 100 %.  Discharge Medication List Allergies as of 05/01/2018      Reactions   Codeine Shortness Of Breath, Palpitations, Other (See Comments)   Rapid heart rate and sweating also   Latex Hives      Medication List    STOP taking these medications   acetaminophen 500 MG tablet Commonly known as:  TYLENOL   ibuprofen 200 MG tablet Commonly known as:  ADVIL,MOTRIN   oxyCODONE 5 MG immediate release tablet Commonly known as:  Oxy IR/ROXICODONE     TAKE these medications   acyclovir ointment 5 % Commonly known as:  ZOVIRAX Apply 1 application topically See admin instructions. Apply to affected areas every 4 hours as needed for fever blisters- for up to 5 days   ADVAIR DISKUS 250-50 MCG/DOSE Aepb Generic drug:  Fluticasone-Salmeterol Inhale 1 puff into the lungs 2 (two) times daily.   albuterol 108 (90 Base) MCG/ACT inhaler Commonly known as:  PROVENTIL HFA;VENTOLIN HFA Inhale 1-2 puffs into the lungs every 6 (six) hours as needed for wheezing or shortness of breath.   ALPRAZolam 0.5 MG tablet Commonly known as:  XANAX Take 0.25-0.5 mg by mouth daily as needed for anxiety.   ARIPiprazole 10 MG tablet Commonly known as:  ABILIFY Take 20 mg by mouth at bedtime.   cetirizine 10 MG tablet Commonly known as:  ZYRTEC Take 10 mg by mouth daily as needed for allergies.   DECARA  69629 units capsule Generic drug:  Cholecalciferol Take 50,000 Units by mouth every Sunday.   EQUETRO 200 MG Cp12 12 hr capsule Generic drug:  carbamazepine Take 600 mg by mouth at bedtime.   Fluocinonide 0.1 % Crea Apply 1 application topically daily as needed (for rashes on arms).   fluticasone 50 MCG/ACT nasal spray Commonly known as:  FLONASE Place 1-2 sprays into both nostrils 2 (two) times daily as needed for allergies or rhinitis (for seasonal allergies).   hydrochlorothiazide 25 MG tablet Commonly known as:  HYDRODIURIL Take 25 mg by mouth every morning.   lamoTRIgine 200 MG tablet Commonly known as:  LAMICTAL Take 400 mg by mouth at bedtime.   levothyroxine 300 MCG tablet Commonly known as:  SYNTHROID, LEVOTHROID Take 300 mcg by mouth daily before breakfast.   lithium carbonate 450 MG CR tablet Commonly known as:  ESKALITH Take 900 mg by mouth at bedtime.   minocycline 50 MG capsule Commonly known as:  MINOCIN,DYNACIN Take 50 mg by mouth 2 (two) times daily.   montelukast 10 MG tablet Commonly known as:  SINGULAIR Take 10 mg by mouth daily.   ondansetron 4 MG disintegrating tablet Commonly known as:  ZOFRAN-ODT Take 1 tablet (4 mg total) by mouth every 8 (eight) hours as needed for nausea or vomiting.   valACYclovir 1000 MG tablet Commonly known as:  VALTREX Take 2,000 mg by mouth 2 (two) times daily as needed (  for fever blisters- for 1 day, as directed).

## 2018-05-01 NOTE — Progress Notes (Signed)
Jenna Navarro 9:56 AM  Subjective: Patient doing well only a minimal amount of pain answered all of her and her parents questions and the procedure and risks were discussed  Objective: Signs stable afebrile no acute distress exam please see previous assessment evaluation CBC is stable liver tests about the same  Assessment: CBD stone  Plan: Okay to proceed with ERCP with anesthesia assistance  [facility]  Pager [phone] After 5PM or if no answer call [phone]

## 2018-05-01 NOTE — Progress Notes (Signed)
Patient verbalized to RN that she thinks she 'has a UTI" RN notified of complaints. MD notified. Awaiting order.  Sim Boast, RN

## 2018-05-02 ENCOUNTER — Encounter (HOSPITAL_COMMUNITY): Payer: Self-pay | Admitting: Gastroenterology

## 2018-05-21 ENCOUNTER — Other Ambulatory Visit: Payer: Self-pay | Admitting: Obstetrics and Gynecology

## 2018-05-21 ENCOUNTER — Encounter: Payer: Self-pay | Admitting: Obstetrics and Gynecology

## 2018-05-21 ENCOUNTER — Ambulatory Visit: Payer: BLUE CROSS/BLUE SHIELD | Admitting: Obstetrics and Gynecology

## 2018-05-21 ENCOUNTER — Telehealth: Payer: Self-pay

## 2018-05-21 ENCOUNTER — Other Ambulatory Visit: Payer: Self-pay

## 2018-05-21 VITALS — BP 122/80 | HR 80 | Resp 24 | Ht 65.0 in | Wt 202.4 lb

## 2018-05-21 DIAGNOSIS — N946 Dysmenorrhea, unspecified: Secondary | ICD-10-CM | POA: Diagnosis not present

## 2018-05-21 DIAGNOSIS — Z23 Encounter for immunization: Secondary | ICD-10-CM

## 2018-05-21 DIAGNOSIS — Z01419 Encounter for gynecological examination (general) (routine) without abnormal findings: Secondary | ICD-10-CM

## 2018-05-21 DIAGNOSIS — E039 Hypothyroidism, unspecified: Secondary | ICD-10-CM | POA: Diagnosis not present

## 2018-05-21 DIAGNOSIS — N920 Excessive and frequent menstruation with regular cycle: Secondary | ICD-10-CM

## 2018-05-21 DIAGNOSIS — Z1231 Encounter for screening mammogram for malignant neoplasm of breast: Secondary | ICD-10-CM

## 2018-05-21 NOTE — Telephone Encounter (Signed)
Thank you for putting in future labs. Encounter closed.

## 2018-05-21 NOTE — Telephone Encounter (Signed)
Spoke with patient and advised she did not get labs at time of appointment today. She states she forgot. She will either wait until she returns for PUS or call tomorrow for lab appointment. Changed labs to future.
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
## 2018-05-21 NOTE — Patient Instructions (Signed)

## 2018-05-27 ENCOUNTER — Other Ambulatory Visit: Payer: BLUE CROSS/BLUE SHIELD

## 2018-05-27 DIAGNOSIS — E039 Hypothyroidism, unspecified: Secondary | ICD-10-CM

## 2018-05-27 DIAGNOSIS — Z01419 Encounter for gynecological examination (general) (routine) without abnormal findings: Secondary | ICD-10-CM

## 2018-05-28 ENCOUNTER — Other Ambulatory Visit: Payer: BLUE CROSS/BLUE SHIELD

## 2018-05-28 ENCOUNTER — Other Ambulatory Visit: Payer: BLUE CROSS/BLUE SHIELD | Admitting: Obstetrics and Gynecology

## 2018-05-28 LAB — COMPREHENSIVE METABOLIC PANEL
A/G RATIO: 2.2 (ref 1.2–2.2)
ALT: 15 IU/L (ref 0–32)
AST: 15 IU/L (ref 0–40)
Albumin: 4.3 g/dL (ref 3.5–5.5)
Alkaline Phosphatase: 100 IU/L (ref 39–117)
BUN/Creatinine Ratio: 11 (ref 9–23)
BUN: 9 mg/dL (ref 6–24)
Bilirubin Total: 0.3 mg/dL (ref 0.0–1.2)
CALCIUM: 9.7 mg/dL (ref 8.7–10.2)
CO2: 21 mmol/L (ref 20–29)
CREATININE: 0.82 mg/dL (ref 0.57–1.00)
Chloride: 108 mmol/L — ABNORMAL HIGH (ref 96–106)
GFR calc Af Amer: 102 mL/min/{1.73_m2} (ref >59)
GFR, EST NON AFRICAN AMERICAN: 89 mL/min/{1.73_m2} (ref >59)
Globulin, Total: 2 g/dL (ref 1.5–4.5)
Glucose: 94 mg/dL (ref 65–99)
POTASSIUM: 4.2 mmol/L (ref 3.5–5.2)
Sodium: 140 mmol/L (ref 134–144)
TOTAL PROTEIN: 6.3 g/dL (ref 6.0–8.5)

## 2018-05-28 LAB — LIPID PANEL
CHOL/HDL RATIO: 2.8 ratio (ref 0.0–4.4)
CHOLESTEROL TOTAL: 190 mg/dL (ref 100–199)
HDL: 68 mg/dL (ref >39)
LDL Calculated: 99 mg/dL (ref 0–99)
TRIGLYCERIDES: 114 mg/dL (ref 0–149)
VLDL Cholesterol Cal: 23 mg/dL (ref 5–40)

## 2018-05-28 LAB — TSH: TSH: 0.157 u[IU]/mL — AB (ref 0.450–4.500)

## 2018-05-28 LAB — T4, FREE: Free T4: 1.4 ng/dL (ref 0.82–1.77)

## 2018-06-04 ENCOUNTER — Encounter: Payer: Self-pay | Admitting: Obstetrics and Gynecology

## 2018-06-04 ENCOUNTER — Other Ambulatory Visit: Payer: Self-pay

## 2018-06-04 ENCOUNTER — Ambulatory Visit (INDEPENDENT_AMBULATORY_CARE_PROVIDER_SITE_OTHER): Payer: BLUE CROSS/BLUE SHIELD

## 2018-06-04 ENCOUNTER — Ambulatory Visit: Payer: BLUE CROSS/BLUE SHIELD | Admitting: Obstetrics and Gynecology

## 2018-06-04 VITALS — BP 116/74 | HR 76 | Ht 65.0 in | Wt 202.2 lb

## 2018-06-04 DIAGNOSIS — N920 Excessive and frequent menstruation with regular cycle: Secondary | ICD-10-CM | POA: Diagnosis not present

## 2018-06-04 DIAGNOSIS — N946 Dysmenorrhea, unspecified: Secondary | ICD-10-CM

## 2018-06-04 DIAGNOSIS — Z308 Encounter for other contraceptive management: Secondary | ICD-10-CM

## 2018-06-04 NOTE — Progress Notes (Signed)
GYNECOLOGY  VISIT   HPI: 42 y.o.   Single  Caucasian  female   G0P0000 with Patient's last menstrual period was 05/20/2018 (exact date).   here for pelvic ultrasound.    Had a CT scan in Sept 2019 showing a small fibroid and dominant right ovarian follicle.  Has regular heavy, and painful menses.  Not able to take NSAIDS due to lithium use.  States every other month her menses is miserable.   May be interested in contraception.  New potential partner.   Followed up with her endocrinologist regarding her thyroid function.  GYNECOLOGIC HISTORY: Patient's last menstrual period was 05/20/2018 (exact date). Contraception: Abstinence Menopausal hormone therapy:  none Last mammogram: never--appt. 07-03-18 Last pap smear: 12/2015 Neg per patient        OB History    Gravida  0   Para  0   Term  0   Preterm  0   AB  0   Living  0     SAB  0   TAB  0   Ectopic  0   Multiple  0   Live Births  0              Patient Active Problem List   Diagnosis Date Noted  . Choledocholithiasis 04/29/2018  . Bipolar disorder in full remission (HCC)   . Uncomplicated asthma   . Hypothyroidism   . Cholecystitis 03/23/2017    Past Medical History:  Diagnosis Date  . Anemia   . Anxiety   . Asthma   . Bipolar 1 disorder (HCC)   . Depression   . Meniere's disease   . Thyroid disease    hashimoto     Past Surgical History:  Procedure Laterality Date  . CHOLECYSTECTOMY N/A 03/23/2017   Procedure: LAPAROSCOPIC CHOLECYSTECTOMY, W/ POSS IOC;  Surgeon: Harriette Bouillon, MD;  Location: [facility] OR;  Service: General;  Laterality: N/A;  . ERCP N/A 05/01/2018   Procedure: ENDOSCOPIC RETROGRADE CHOLANGIOPANCREATOGRAPHY (ERCP);  Surgeon: Vida Rigger, MD;  Location: [facility] ENDOSCOPY;  Service: Endoscopy;  Laterality: N/A;  . REMOVAL OF STONES  05/01/2018   Procedure: REMOVAL OF STONES;  Surgeon: Vida Rigger, MD;  Location: [facility] ENDOSCOPY;  Service: Endoscopy;;  . Dennison Mascot  05/01/2018   Procedure: Dennison Mascot;  Surgeon: Vida Rigger, MD;  Location: [facility] ENDOSCOPY;  Service: Endoscopy;;    Current Outpatient Medications  Medication Sig Dispense Refill  . acyclovir ointment (ZOVIRAX) 5 % Apply 1 application topically See admin instructions. Apply to affected areas every 4 hours as needed for fever blisters- for up to 5 days  2  . ADVAIR DISKUS 250-50 MCG/DOSE AEPB Inhale 1 puff into the lungs 2 (two) times daily.  3  . albuterol (PROVENTIL HFA;VENTOLIN HFA) 108 (90 Base) MCG/ACT inhaler Inhale 1-2 puffs into the lungs every 6 (six) hours as needed for wheezing or shortness of breath.    . ALPRAZolam (XANAX) 0.5 MG tablet Take 0.25-0.5 mg by mouth daily as needed for anxiety.   1  . ARIPiprazole (ABILIFY) 10 MG tablet Take 20 mg by mouth at bedtime.   1  . Armodafinil 50 MG tablet Take 1 tablet by mouth as needed.  0  . cetirizine (ZYRTEC) 10 MG tablet Take 10 mg by mouth daily as needed for allergies.    Marland Kitchen DECARA 16109 units capsule Take 50,000 Units by mouth every Sunday.  2  . EQUETRO 200 MG CP12 12 hr capsule Take 600 mg by mouth at bedtime.  2  .  Fluocinonide 0.1 % CREA Apply 1 application topically daily as needed (for rashes on arms).   2  . fluticasone (FLONASE) 50 MCG/ACT nasal spray Place 1-2 sprays into both nostrils 2 (two) times daily as needed for allergies or rhinitis (for seasonal allergies).    . hydrochlorothiazide (HYDRODIURIL) 25 MG tablet Take 25 mg by mouth every morning.  9  . lamoTRIgine (LAMICTAL) 200 MG tablet Take 400 mg by mouth at bedtime.   2  . levothyroxine (SYNTHROID, LEVOTHROID) 300 MCG tablet Take 300 mcg by mouth daily before breakfast.    . lithium carbonate (ESKALITH) 450 MG CR tablet Take 900 mg by mouth at bedtime.   1  . montelukast (SINGULAIR) 10 MG tablet Take 10 mg by mouth daily.  3  . valACYclovir (VALTREX) 1000 MG tablet Take 2,000 mg by mouth 2 (two) times daily as needed (for fever blisters- for 1 day, as directed).      No  current facility-administered medications for this visit.      ALLERGIES: Codeine and Latex  Family History  Problem Relation Age of Onset  . Diabetes Father   . Hypertension Father   . Hypertension Maternal Grandfather     Social History   Socioeconomic History  . Marital status: Single    Spouse name: Not on file  . Number of children: Not on file  . Years of education: Not on file  . Highest education level: Not on file  Occupational History  . Not on file  Social Needs  . Financial resource strain: Not on file  . Food insecurity:    Worry: Not on file    Inability: Not on file  . Transportation needs:    Medical: Not on file    Non-medical: Not on file  Tobacco Use  . Smoking status: Never Smoker  . Smokeless tobacco: Never Used  Substance and Sexual Activity  . Alcohol use: No  . Drug use: No  . Sexual activity: Not Currently    Birth control/protection: Abstinence  Lifestyle  . Physical activity:    Days per week: Not on file    Minutes per session: Not on file  . Stress: Not on file  Relationships  . Social connections:    Talks on phone: Not on file    Gets together: Not on file    Attends religious service: Not on file    Active member of club or organization: Not on file    Attends meetings of clubs or organizations: Not on file    Relationship status: Not on file  . Intimate partner violence:    Fear of current or ex partner: Not on file    Emotionally abused: Not on file    Physically abused: Not on file    Forced sexual activity: Not on file  Other Topics Concern  . Not on file  Social History Narrative  . Not on file    Review of Systems  Psychiatric/Behavioral:       Worsening depression  All other systems reviewed and are negative.   PHYSICAL EXAMINATION:    BP 116/74 (BP Location: Right Arm, Patient Position: Sitting, Cuff Size: Large)   Pulse 76   Ht 5\' 5"  (1.651 m)   Wt 202 lb 3.2 oz (91.7 kg)   LMP 05/20/2018 (Exact Date)    BMI 33.65 kg/m     General appearance: alert, cooperative and appears stated age    Pelvic US Uterus no masses.  EMS 8.15  mm.  Small left CL cyst.  Mod free fluid.   ASSESSMENT  Elevated liver function studies.  Resolved.  Hypothyroidism.  On Synthroid.  Low TSH recently. Menorrhagia and dysmenorrhea.  Uterine fibroid possible by CT scan. Desire for reliable contraception. Depression.  Unable to take NSAIDs.  PLAN  We reviewed options to treatment of painful and heavy menses and prevention of pregnancy.  COCs, POPs, Depo provera, Nexplanon, progesterone IUDs discussed.  Discussed Rutha Bouchard and Mirena risks and benefits in detail. We reviewed potential interaction of her medications decreasing the effectiveness of a progesterone IUD.  The patient wishes to proceed with this and will use back up protection of condoms. Plan for Cytotec 200 mcg the evening prior to procedure and the am of procedure.  Plan for a paracervical block also.  Questions invited and answered.  An After Visit Summary was printed and given to the patient.  __25____ minutes face to face time of which over 50% was spent in counseling.

## 2018-06-15 ENCOUNTER — Telehealth: Payer: Self-pay | Admitting: Obstetrics and Gynecology

## 2018-06-15 MED ORDER — MISOPROSTOL 200 MCG PO TABS: ORAL_TABLET | ORAL | 0 refills | Status: DC

## 2018-06-15 NOTE — Telephone Encounter (Signed)
Spoke with patient. LMP 06/15/18, requesting to schedule Mirena/Kyleena IUD insertion with Dr. Edward JollySilva.   IUD insertion scheduled for 06/17/18 at 3:30pm with Dr. Edward JollySilva. RX for cytotec to verified pharmacy, instructions reviewed. Advised to take Motrin 800 mg with food and water one hour before procedure.   Routing to provider for final review. Patient is agreeable to disposition. Will close encounter.  Cc: Harland DingwallSuzy Dixon

## 2018-06-15 NOTE — Telephone Encounter (Signed)
Left message to call Noreene [facility]Jill[facility] [phone].    Last OV 06/04/18, order placed for IUD insertion.  Plan for Cytotec 200 mcg the evening prior to procedure and the am of procedure.  Plan for a paracervical block also.

## 2018-06-15 NOTE — Telephone Encounter (Signed)
Patients's cycle started today and she's calling to schedule iud procedure.

## 2018-06-17 ENCOUNTER — Other Ambulatory Visit: Payer: Self-pay

## 2018-06-17 ENCOUNTER — Ambulatory Visit (INDEPENDENT_AMBULATORY_CARE_PROVIDER_SITE_OTHER): Payer: BLUE CROSS/BLUE SHIELD | Admitting: Obstetrics and Gynecology

## 2018-06-17 VITALS — BP 114/62 | HR 70 | Ht 65.0 in | Wt 206.4 lb

## 2018-06-17 DIAGNOSIS — Z308 Encounter for other contraceptive management: Secondary | ICD-10-CM

## 2018-06-17 DIAGNOSIS — Z30430 Encounter for insertion of intrauterine contraceptive device: Secondary | ICD-10-CM | POA: Diagnosis not present

## 2018-06-17 DIAGNOSIS — Z01812 Encounter for preprocedural laboratory examination: Secondary | ICD-10-CM

## 2018-06-17 LAB — POCT URINE PREGNANCY: PREG TEST UR: NEGATIVE

## 2018-06-17 NOTE — Patient Instructions (Signed)

## 2018-06-17 NOTE — Progress Notes (Signed)
GYNECOLOGY  VISIT   HPI: 42 y.o.   Single  Caucasian  female   G0P0000 with Patient's last menstrual period was 06/15/2018 (exact date).   here for Mirena/Kyleena IUD insertion.   Asking about interactions of a progesterone IUD with her mood stabilizers.  Struggling with her depression.  Has painful menses.  Considering sexual activity.   GYNECOLOGIC HISTORY: Patient's last menstrual period was 06/15/2018 (exact date). Contraception: Abstinence Menopausal hormone therapy:  none Last mammogram:  Never--Appt 07-03-18 Last pap smear:   12/2015 Neg per patient        OB History    Gravida  0   Para  0   Term  0   Preterm  0   AB  0   Living  0     SAB  0   TAB  0   Ectopic  0   Multiple  0   Live Births  0              Patient Active Problem List   Diagnosis Date Noted  . Choledocholithiasis 04/29/2018  . Bipolar disorder in full remission (HCC)   . Uncomplicated asthma   . Hypothyroidism   . Cholecystitis 03/23/2017    Past Medical History:  Diagnosis Date  . Anemia   . Anxiety   . Asthma   . Bipolar 1 disorder (HCC)   . Depression   . Meniere's disease   . Thyroid disease    hashimoto     Past Surgical History:  Procedure Laterality Date  . CHOLECYSTECTOMY N/A 03/23/2017   Procedure: LAPAROSCOPIC CHOLECYSTECTOMY, W/ POSS IOC;  Surgeon: Harriette Bouillon, MD;  Location: [facility] OR;  Service: General;  Laterality: N/A;  . ERCP N/A 05/01/2018   Procedure: ENDOSCOPIC RETROGRADE CHOLANGIOPANCREATOGRAPHY (ERCP);  Surgeon: Vida Rigger, MD;  Location[facility] ENDOSCOPY;  Service: Endoscopy;  Laterality: N/A;  . REMOVAL OF STONES  05/01/2018   Procedure: REMOVAL OF STONES;  Surgeon: Vida Rigger, MD;  Location: [facility] ENDOSCOPY;  Service: Endoscopy;;  . Dennison Mascot  05/01/2018   Procedure: Dennison Mascot;  Surgeon: Vida Rigger, MD;  Location: [facility] ENDOSCOPY;  Service: Endoscopy;;    Current Outpatient Medications  Medication Sig Dispense Refill  . acyclovir  ointment (ZOVIRAX) 5 % Apply 1 application topically See admin instructions. Apply to affected areas every 4 hours as needed for fever blisters- for up to 5 days  2  . ADVAIR DISKUS 250-50 MCG/DOSE AEPB Inhale 1 puff into the lungs 2 (two) times daily.  3  . albuterol (PROVENTIL HFA;VENTOLIN HFA) 108 (90 Base) MCG/ACT inhaler Inhale 1-2 puffs into the lungs every 6 (six) hours as needed for wheezing or shortness of breath.    . ALPRAZolam (XANAX) 0.5 MG tablet Take 0.25-0.5 mg by mouth daily as needed for anxiety.   1  . ARIPiprazole (ABILIFY) 20 MG tablet Take 1 tablet by mouth daily.  0  . Armodafinil 50 MG tablet Take 1 tablet by mouth as needed.  0  . cetirizine (ZYRTEC) 10 MG tablet Take 10 mg by mouth daily as needed for allergies.    Marland Kitchen DECARA 09811 units capsule Take 50,000 Units by mouth every Sunday.  2  . EQUETRO 200 MG CP12 12 hr capsule Take 600 mg by mouth at bedtime.  2  . Fluocinonide 0.1 % CREA Apply 1 application topically daily as needed (for rashes on arms).   2  . fluticasone (FLONASE) 50 MCG/ACT nasal spray Place 1-2 sprays into both nostrils 2 (two) times  daily as needed for allergies or rhinitis (for seasonal allergies).    . hydrochlorothiazide (HYDRODIURIL) 25 MG tablet Take 25 mg by mouth every morning.  9  . lamoTRIgine (LAMICTAL) 200 MG tablet Take 400 mg by mouth at bedtime.   2  . levothyroxine (SYNTHROID, LEVOTHROID) 300 MCG tablet Take 300 mcg by mouth daily before breakfast.    . lithium carbonate (ESKALITH) 450 MG CR tablet Take 900 mg by mouth at bedtime.   1  . misoprostol (CYTOTEC) 200 MCG tablet Place one tablet ( ) vaginally night before procedure and place one tab vaginally morning of procedure. 2 tablet 0  . montelukast (SINGULAIR) 10 MG tablet Take 10 mg by mouth daily.  3  . valACYclovir (VALTREX) 1000 MG tablet Take 2,000 mg by mouth 2 (two) times daily as needed (for fever blisters- for 1 day, as directed).      No current facility-administered  medications for this visit.      ALLERGIES: Codeine and Latex  Family History  Problem Relation Age of Onset  . Diabetes Father   . Hypertension Father   . Hypertension Maternal Grandfather     Social History   Socioeconomic History  . Marital status: Single    Spouse name: Not on file  . Number of children: Not on file  . Years of education: Not on file  . Highest education level: Not on file  Occupational History  . Not on file  Social Needs  . Financial resource strain: Not on file  . Food insecurity:    Worry: Not on file    Inability: Not on file  . Transportation needs:    Medical: Not on file    Non-medical: Not on file  Tobacco Use  . Smoking status: Never Smoker  . Smokeless tobacco: Never Used  Substance and Sexual Activity  . Alcohol use: No  . Drug use: No  . Sexual activity: Not Currently    Birth control/protection: Abstinence  Lifestyle  . Physical activity:    Days per week: Not on file    Minutes per session: Not on file  . Stress: Not on file  Relationships  . Social connections:    Talks on phone: Not on file    Gets together: Not on file    Attends religious service: Not on file    Active member of club or organization: Not on file    Attends meetings of clubs or organizations: Not on file    Relationship status: Not on file  . Intimate partner violence:    Fear of current or ex partner: Not on file    Emotionally abused: Not on file    Physically abused: Not on file    Forced sexual activity: Not on file  Other Topics Concern  . Not on file  Social History Narrative  . Not on file    Review of Systems  All other systems reviewed and are negative.   PHYSICAL EXAMINATION:    BP 114/62 (BP Location: Right Arm, Patient Position: Sitting, Cuff Size: Large)   Pulse 70   Ht 5\' 5"  (1.651 m)   Wt 206 lb 6.4 oz (93.6 kg)   LMP 06/15/2018 (Exact Date)   BMI 34.35 kg/m     General appearance: alert, cooperative and appears stated  age   Pelvic: External genitalia:  no lesions              Urethra:  normal appearing urethra with no masses, tenderness  or lesions              Bartholins and Skenes: normal                 Vagina: normal appearing vagina with normal color and discharge, no lesions              Cervix: no lesions                Bimanual Exam:  Uterus:  normal size, contour, position, consistency, mobility, non-tender              Adnexa: no mass, fullness, tenderness          Consent for Mirena IUD insertion.  Lot  [id number]     , expiration  Jan 2022.Marland Kitchen. Speculum placed in vagina.  Sterile prep of cervix with Hibiclens.  Paracervical block with 10 cc 1% lidocaine - lot [id number]     , expiration  Jan. 2022. Tenaculum to anterior cervical lip.  Uterus sounded to  6.5   cm.  Mirena  IUD placed without difficulty.  Strings trimmed and shown to patient.  Repeat bimanual exam, no change. No complications.  Minimal EBL.  Chaperone was present for exam.  ASSESSMENT  Bipolar disorder.  Need for contraception.  Dysmenorrhea. Mirena IUD.  PLAN  Potential interaction between Mirena and Tegretol and Mirena and Armodafinil, each of these meds decreasing the effectiveness of the Mirena. Condoms recommended.  Instructions and precautions given.  Back up contraception discussed. Card given to patient with insertion date, recommended removal date, and lot number.  Follow up for a recheck in 4 - 5 weeks, sooner as needed.  An After Visit Summary was printed and given to the patient.

## 2018-06-19 ENCOUNTER — Encounter: Payer: Self-pay | Admitting: Obstetrics and Gynecology

## 2018-07-03 ENCOUNTER — Ambulatory Visit
Admission: RE | Admit: 2018-07-03 | Discharge: 2018-07-03 | Disposition: A | Discharge status: Home / Self Care | Payer: BLUE CROSS/BLUE SHIELD | Source: Ambulatory Visit | Attending: Obstetrics and Gynecology | Admitting: Obstetrics and Gynecology

## 2018-07-03 DIAGNOSIS — Z1231 Encounter for screening mammogram for malignant neoplasm of breast: Secondary | ICD-10-CM

## 2018-07-10 ENCOUNTER — Telehealth: Payer: Self-pay | Admitting: Obstetrics and Gynecology

## 2018-07-10 NOTE — Telephone Encounter (Signed)
Patient says she is having a period with bright red blood after her iud insertion.

## 2018-07-10 NOTE — Telephone Encounter (Signed)
Encounter closed

## 2018-07-10 NOTE — Telephone Encounter (Signed)
Spoke with patient. Mirena IUD placed while on menses 06/17/18. Reports bleeding approximately 2 wks after insertion and bleeding started again 12/9. Describes flow as "normal", no heavy bleeding. Reports menses cramps 3/10, advil prn provides relief. Denies N/V, fever, vaginal odor or abnormal d/c. Reports chills, states this is not new for her since she has lost 85 lbs. Has not checked for IUD strings, is not SA.   Advised patient can take at least 3 months for menses to adjust to new contraceptive. Advised to continue to monitor bleeding, return call to office if bleeding becomes heavy,  severe pain or new symptoms develop. WH/ER after hours.  Keep f/u appt as scheduled for 12/27 with Dr. Edward JollySilva. Advised I will review with Dr. Edward JollySilva and return call if any additional recommendations.   Routing to Dr. Edward JollySilva to review.

## 2018-07-10 NOTE — Telephone Encounter (Signed)
I agree with your recommendations. 

## 2018-07-23 NOTE — Progress Notes (Signed)
GYNECOLOGY  VISIT   HPI: 42 y.o.   Single  Caucasian  female   G0P0000 with Patient's last menstrual period was 07/13/2018 (exact date).   here for follow up after Mirena IUD insertion and 2nd Gardasil injection.  States she has been bleeding since the IUD was placed.  Now spotting only.  Some cramping, but less than before.   Asking about dense breast tissue on her mammogram.  GYNECOLOGIC HISTORY: Patient's last menstrual period was 07/13/2018 (exact date). Contraception: Mirena IUD 06-17-18 Menopausal hormone therapy:  n/a Last mammogram: 07-03-18 Neg/density C/BiRads1 Last pap smear:  12/2015 Neg per patient        OB History    Gravida  0   Para  0   Term  0   Preterm  0   AB  0   Living  0     SAB  0   TAB  0   Ectopic  0   Multiple  0   Live Births  0              Patient Active Problem List   Diagnosis Date Noted  . Choledocholithiasis 04/29/2018  . Bipolar disorder in full remission (HCC)   . Uncomplicated asthma   . Hypothyroidism   . Cholecystitis 03/23/2017    Past Medical History:  Diagnosis Date  . Anemia   . Anxiety   . Asthma   . Bipolar 1 disorder (HCC)   . Depression   . Meniere's disease   . Thyroid disease    hashimoto     Past Surgical History:  Procedure Laterality Date  . CHOLECYSTECTOMY N/A 03/23/2017   Procedure: LAPAROSCOPIC CHOLECYSTECTOMY, W/ POSS IOC;  Surgeon: Harriette Bouillonornett, Thomas, MD;  Location: [facility] OR;  Service: General;  Laterality: N/A;  . ERCP N/A 05/01/2018   Procedure: ENDOSCOPIC RETROGRADE CHOLANGIOPANCREATOGRAPHY (ERCP);  Surgeon: Vida RiggerMagod, Marc, MD;  [facility];  Service: Endoscopy;  Laterality: N/A;  . REMOVAL OF STONES  05/01/2018   Procedure: REMOVAL OF STONES;  Surgeon: Vida RiggerMagod, Marc, MD;  [facility];  Service: Endoscopy;;  . Dennison MascotSPHINCTEROTOMY  05/01/2018   Procedure: Dennison MascotSPHINCTEROTOMY;  Surgeon: Vida RiggerMagod, Marc, MD;  [facility];  Service: Endoscopy;;    Current Outpatient Medications   Medication Sig Dispense Refill  . acyclovir ointment (ZOVIRAX) 5 % Apply 1 application topically See admin instructions. Apply to affected areas every 4 hours as needed for fever blisters- for up to 5 days  2  . ADVAIR DISKUS 250-50 MCG/DOSE AEPB Inhale 1 puff into the lungs 2 (two) times daily.  3  . albuterol (PROVENTIL HFA;VENTOLIN HFA) 108 (90 Base) MCG/ACT inhaler Inhale 1-2 puffs into the lungs every 6 (six) hours as needed for wheezing or shortness of breath.    . ALPRAZolam (XANAX) 0.5 MG tablet Take 0.25-0.5 mg by mouth daily as needed for anxiety.   1  . ARIPiprazole (ABILIFY) 30 MG tablet Take 30 mg by mouth daily.    . cetirizine (ZYRTEC) 10 MG tablet Take 10 mg by mouth daily as needed for allergies.    Marland Kitchen. DECARA 7253650000 units capsule Take 50,000 Units by mouth every Sunday.  2  . Fluocinonide 0.1 % CREA Apply 1 application topically daily as needed (for rashes on arms).   2  . fluticasone (FLONASE) 50 MCG/ACT nasal spray Place 1-2 sprays into both nostrils 2 (two) times daily as needed for allergies or rhinitis (for seasonal allergies).    . hydrochlorothiazide (HYDRODIURIL) 25 MG tablet Take  25 mg by mouth every morning.  9  . lamoTRIgine (LAMICTAL) 200 MG tablet Take 400 mg by mouth at bedtime.   2  . levothyroxine (SYNTHROID, LEVOTHROID) 300 MCG tablet Take 300 mcg by mouth daily before breakfast.    . lithium carbonate (ESKALITH) 450 MG CR tablet Take 900 mg by mouth at bedtime.   1  . montelukast (SINGULAIR) 10 MG tablet Take 10 mg by mouth daily.  3  . valACYclovir (VALTREX) 1000 MG tablet Take 2,000 mg by mouth 2 (two) times daily as needed (for fever blisters- for 1 day, as directed).      No current facility-administered medications for this visit.      ALLERGIES: Codeine and Latex  Family History  Problem Relation Age of Onset  . Diabetes Father   . Hypertension Father   . Hypertension Maternal Grandfather     Social History   Socioeconomic History  . Marital  status: Single    Spouse name: Not on file  . Number of children: Not on file  . Years of education: Not on file  . Highest education level: Not on file  Occupational History  . Not on file  Social Needs  . Financial resource strain: Not on file  . Food insecurity:    Worry: Not on file    Inability: Not on file  . Transportation needs:    Medical: Not on file    Non-medical: Not on file  Tobacco Use  . Smoking status: Never Smoker  . Smokeless tobacco: Never Used  Substance and Sexual Activity  . Alcohol use: No  . Drug use: No  . Sexual activity: Not Currently    Birth control/protection: Abstinence  Lifestyle  . Physical activity:    Days per week: Not on file    Minutes per session: Not on file  . Stress: Not on file  Relationships  . Social connections:    Talks on phone: Not on file    Gets together: Not on file    Attends religious service: Not on file    Active member of club or organization: Not on file    Attends meetings of clubs or organizations: Not on file    Relationship status: Not on file  . Intimate partner violence:    Fear of current or ex partner: Not on file    Emotionally abused: Not on file    Physically abused: Not on file    Forced sexual activity: Not on file  Other Topics Concern  . Not on file  Social History Narrative  . Not on file    Review of Systems  All other systems reviewed and are negative.   PHYSICAL EXAMINATION:    BP 110/64 (BP Location: Right Arm, Patient Position: Sitting, Cuff Size: Large)   Pulse 70   Ht 5\' 5"  (1.651 m)   Wt 207 lb (93.9 kg)   LMP 07/13/2018 (Exact Date)   BMI 34.45 kg/m     General appearance: alert, cooperative and appears stated age  Pelvic: External genitalia:  no lesions              Urethra:  normal appearing urethra with no masses, tenderness or lesions              Bartholins and Skenes: normal                 Vagina: normal appearing vagina with normal color and discharge, no  lesions  Cervix: no lesions.  IUD strings noted.  Minimal spotting.                 Bimanual Exam:  Uterus:  normal size, contour, position, consistency, mobility, non-tender              Adnexa: no mass, fullness, tenderness          Chaperone was present for exam.  ASSESSMENT  Mirena IUD.  Doing well overall. Gardasil series.  Cat C breast density.   PLAN  Reassurance regarding bleeding profile with Mirena.  Gardasil #2 today.  Discussed density of breasts and potential benefits of 3D mammogram.  I also recommend yearly mammogram, self exam, and clinical breast exam at her annual exams.    An After Visit Summary was printed and given to the patient.  __15____ minutes face to face time of which over 50% was spent in counseling.

## 2018-07-24 ENCOUNTER — Encounter: Payer: Self-pay | Admitting: Obstetrics and Gynecology

## 2018-07-24 ENCOUNTER — Ambulatory Visit: Payer: BLUE CROSS/BLUE SHIELD | Admitting: Obstetrics and Gynecology

## 2018-07-24 ENCOUNTER — Ambulatory Visit: Payer: BLUE CROSS/BLUE SHIELD

## 2018-07-24 ENCOUNTER — Other Ambulatory Visit: Payer: Self-pay

## 2018-07-24 VITALS — BP 110/64 | HR 70 | Ht 65.0 in | Wt 207.0 lb

## 2018-07-24 DIAGNOSIS — Z30431 Encounter for routine checking of intrauterine contraceptive device: Secondary | ICD-10-CM

## 2018-07-24 DIAGNOSIS — R922 Inconclusive mammogram: Secondary | ICD-10-CM | POA: Diagnosis not present

## 2018-07-24 DIAGNOSIS — Z23 Encounter for immunization: Secondary | ICD-10-CM | POA: Diagnosis not present

## 2018-07-24 DIAGNOSIS — R9230 Dense breasts, unspecified: Secondary | ICD-10-CM

## 2018-10-06 ENCOUNTER — Telehealth: Payer: Self-pay | Admitting: Obstetrics and Gynecology

## 2018-10-06 NOTE — Telephone Encounter (Signed)
Spoke with patient. Mirena IUD placed 06/17/18. Cycles have been consistently lasting longer than 10 days each month. Changes pad every couple of hours during first few days, then 2-3 x/day. Pad is not saturated. LMP 09/26/18 to current. Reports feeling cold all of the time, denies fever. Reports fatigue, dizziness and and SOB. Patient states she has asthma and is on "several daily psych medications, hard to know which is the cause". Dizziness and SOB not new, "more intense at times". Denies SOB and dizziness at this time.   OV scheduled for 3/12 at 3:30pm with Dr. Edward Jolly. Advised patient should symptoms return, new symptoms develop or worsen, needs to be seen in ER for further evaluation. Advised I will review with covering provider and return call if any additional recommendations. Patient agreeable.   Dr. Hyacinth Meeker -please review.   Cc: Dr. Edward Jolly

## 2018-10-06 NOTE — Telephone Encounter (Signed)
Call reviewed with Dr. Hyacinth Meeker, call returned to patient.  Ok to keep OV as scheduled for further evaluation of irregular menses, Dr. Edward Jolly may recommend PUS. Need to f/u with PCP regarding other symptoms. Advised I will review with Dr. Edward Jolly on 3/11 and return call if any additional recommendations. Patient verbalizes understanding and is agreeable.   Routing to Dr. Edward Jolly.

## 2018-10-06 NOTE — Telephone Encounter (Signed)
Patient had IUD placed 06/17/18 and is still having 1-2 week long cycles.

## 2018-10-07 NOTE — Telephone Encounter (Signed)
I agree with appointment with me.  OK to close encounter.

## 2018-10-08 ENCOUNTER — Other Ambulatory Visit: Payer: Self-pay

## 2018-10-08 ENCOUNTER — Ambulatory Visit: Payer: BLUE CROSS/BLUE SHIELD | Admitting: Obstetrics and Gynecology

## 2018-10-08 ENCOUNTER — Encounter: Payer: Self-pay | Admitting: Obstetrics and Gynecology

## 2018-10-08 VITALS — BP 118/60 | HR 84 | Resp 16 | Ht 66.0 in | Wt 207.0 lb

## 2018-10-08 DIAGNOSIS — R6889 Other general symptoms and signs: Secondary | ICD-10-CM

## 2018-10-08 DIAGNOSIS — N926 Irregular menstruation, unspecified: Secondary | ICD-10-CM

## 2018-10-08 DIAGNOSIS — Z30431 Encounter for routine checking of intrauterine contraceptive device: Secondary | ICD-10-CM | POA: Diagnosis not present

## 2018-10-08 NOTE — Patient Instructions (Signed)
I would have you consider starting Vivelle Dot transdermal estrogen 0.05 mg twice weekly to control your irregular bleeding with Mirena IUD.  Your physician prescribing your psychiatric medications will need to approve this as there can be some interaction with Lamictal.   I will need to have some feedback from this provider's office regarding your potential new prescription.

## 2018-10-08 NOTE — Progress Notes (Signed)
GYNECOLOGY  VISIT   HPI: 43 y.o.   Single  Caucasian  female   G0P0000 with Patient's last menstrual period was 09/26/2018.   here for long periods. Per patient since having the Mirena, periods last between 10-14 days and can be painful.  Menstruation was heavy enough to need a pad during the period.  Bleeding can start and stop during the 10 - 14 days.  Increased bleeding with physical activity.   Had 2 Advil days per month instead of 4 -5 days.  Always mildly tender which she can live with.  Feels the IUD is worth it due to less pain.   The bleeding is her major concern.   Reports cold all the time.  Her endocrinologist is monitoring her thyroid medication because she was hyperthyroid.  Not sexually active since the IUD was placed.   Pelvic US 06/04/18 Uterus no masses.  EMS 8.15 mm.  Small left CL cyst.  Mod free fluid.   Mirena IUD placed on 08/17/17.   GYNECOLOGIC HISTORY: Patient's last menstrual period was 09/26/2018. Contraception:  Mirena IUD 06/17/18  Menopausal hormone therapy:  n/a Last mammogram:  07/03/18 BIRADS 1 negative/density c Last pap smear:   12/2015 Neg per patient        OB History    Gravida  0   Para  0   Term  0   Preterm  0   AB  0   Living  0     SAB  0   TAB  0   Ectopic  0   Multiple  0   Live Births  0              Patient Active Problem List   Diagnosis Date Noted  . Choledocholithiasis 04/29/2018  . Bipolar disorder in full remission (HCC)   . Uncomplicated asthma   . Hypothyroidism   . Cholecystitis 03/23/2017    Past Medical History:  Diagnosis Date  . Anemia   . Anxiety   . Asthma   . Bipolar 1 disorder (HCC)   . Depression   . Meniere's disease   . Thyroid disease    hashimoto     Past Surgical History:  Procedure Laterality Date  . CHOLECYSTECTOMY N/A 03/23/2017   Procedure: LAPAROSCOPIC CHOLECYSTECTOMY, W/ POSS IOC;  Surgeon: Harriette Bouillon, MD;  Location: [facility] OR;  Service: General;   Laterality: N/A;  . ERCP N/A 05/01/2018   Procedure: ENDOSCOPIC RETROGRADE CHOLANGIOPANCREATOGRAPHY (ERCP);  Surgeon: Vida Rigger, MD;  Location[facility] ENDOSCOPY;  Service: Endoscopy;  Laterality: N/A;  . REMOVAL OF STONES  05/01/2018   Procedure: REMOVAL OF STONES;  Surgeon: Vida Rigger, MD;  Location: [facility] ENDOSCOPY;  Service: Endoscopy;;  . Dennison Mascot  05/01/2018   Procedure: Dennison Mascot;  Surgeon: Vida Rigger, MD;  Location: [facility] ENDOSCOPY;  Service: Endoscopy;;    Current Outpatient Medications  Medication Sig Dispense Refill  . acyclovir ointment (ZOVIRAX) 5 % Apply 1 application topically See admin instructions. Apply to affected areas every 4 hours as needed for fever blisters- for up to 5 days  2  . ADVAIR DISKUS 250-50 MCG/DOSE AEPB Inhale 1 puff into the lungs 2 (two) times daily.  3  . albuterol (PROVENTIL HFA;VENTOLIN HFA) 108 (90 Base) MCG/ACT inhaler Inhale 1-2 puffs into the lungs every 6 (six) hours as needed for wheezing or shortness of breath.    . ALPRAZolam (XANAX) 0.5 MG tablet Take 0.25-0.5 mg by mouth daily as needed for anxiety.   1  .  ARIPiprazole (ABILIFY) 20 MG tablet TK 1 T PO ONCE D    . cetirizine (ZYRTEC) 10 MG tablet Take 10 mg by mouth daily as needed for allergies.    Marland Kitchen DECARA 02725 units capsule Take 50,000 Units by mouth every Sunday.  2  . Fluocinonide 0.1 % CREA Apply 1 application topically daily as needed (for rashes on arms).   2  . fluticasone (FLONASE) 50 MCG/ACT nasal spray Place 1-2 sprays into both nostrils 2 (two) times daily as needed for allergies or rhinitis (for seasonal allergies).    . hydrochlorothiazide (HYDRODIURIL) 25 MG tablet Take 25 mg by mouth every morning.  9  . lamoTRIgine (LAMICTAL) 200 MG tablet Take 400 mg by mouth at bedtime.   2  . levonorgestrel (MIRENA) 20 MCG/24HR IUD by Intrauterine route.    Marland Kitchen levothyroxine (SYNTHROID, LEVOTHROID) 125 MCG tablet Take 125 mcg by mouth 2 (two) times daily.    Marland Kitchen lithium carbonate (ESKALITH)  450 MG CR tablet Take 900 mg by mouth at bedtime.   1  . montelukast (SINGULAIR) 10 MG tablet Take 10 mg by mouth daily.  3  . valACYclovir (VALTREX) 1000 MG tablet Take 2,000 mg by mouth 2 (two) times daily as needed (for fever blisters- for 1 day, as directed).      No current facility-administered medications for this visit.      ALLERGIES: Codeine and Latex  Family History  Problem Relation Age of Onset  . Diabetes Father   . Hypertension Father   . Hypertension Maternal Grandfather     Social History   Socioeconomic History  . Marital status: Single    Spouse name: Not on file  . Number of children: Not on file  . Years of education: Not on file  . Highest education level: Not on file  Occupational History  . Not on file  Social Needs  . Financial resource strain: Not on file  . Food insecurity:    Worry: Not on file    Inability: Not on file  . Transportation needs:    Medical: Not on file    Non-medical: Not on file  Tobacco Use  . Smoking status: Never Smoker  . Smokeless tobacco: Never Used  Substance and Sexual Activity  . Alcohol use: No  . Drug use: No  . Sexual activity: Not Currently    Birth control/protection: Abstinence  Lifestyle  . Physical activity:    Days per week: Not on file    Minutes per session: Not on file  . Stress: Not on file  Relationships  . Social connections:    Talks on phone: Not on file    Gets together: Not on file    Attends religious service: Not on file    Active member of club or organization: Not on file    Attends meetings of clubs or organizations: Not on file    Relationship status: Not on file  . Intimate partner violence:    Fear of current or ex partner: Not on file    Emotionally abused: Not on file    Physically abused: Not on file    Forced sexual activity: Not on file  Other Topics Concern  . Not on file  Social History Narrative  . Not on file    Review of Systems  Constitutional:       Weight  gain  HENT: Negative.   Eyes: Negative.   Respiratory: Negative.   Cardiovascular: Negative.   Gastrointestinal: Negative.  Endocrine: Positive for cold intolerance.  Genitourinary:       Excess bleeding Painful periods  Musculoskeletal: Negative.   Skin: Negative.   Allergic/Immunologic: Negative.   Neurological: Negative.   Hematological: Negative.   Psychiatric/Behavioral: Positive for dysphoric mood.    PHYSICAL EXAMINATION:    BP 118/60 (BP Location: Right Arm, Patient Position: Sitting, Cuff Size: Large)   Pulse 84   Resp 16   Ht  (1.676 m)   Wt 207 lb (93.9 kg)   LMP 09/26/2018   BMI 33.41 kg/m     General appearance: alert, cooperative and appears stated age   Pelvic: External genitalia:  no lesions              Urethra:  normal appearing urethra with no masses, tenderness or lesions              Bartholins and Skenes: normal                 Vagina: normal appearing vagina with normal color and discharge, no lesions              Cervix: no lesions.  IUD threads noted.  Small amount of blood noted.                Bimanual Exam:  Uterus:  normal size, contour, position, consistency, mobility, non-tender              Adnexa: no mass, fullness, tenderness                Chaperone was present for exam.  ASSESSMENT   Mirena IUD.  Irregular spotting.  Bipolar disorder.  Cold intolerance.  PLAN  We talked about bleeding profiles with Mirena.  We reviewed options for care - observation or transdermal estrogen for 6 months.  She prefers to try the transdermal estrogen.  I would like to start Vivelle Dot 0.05 mg twice weekly.  I discussed potential risks of estrogen - stroke, DVT, PE, MI.  She will reach out to her psychiatrist regarding the estrogen use as there can be interaction with her Lamictal.  I have asked for her provider to be in touch with me.  Check CBC.   An After Visit Summary was printed and given to the patient.  __25____ minutes face to  face time of which over 50% was spent in counseling.

## 2018-10-09 LAB — CBC
HEMATOCRIT: 40.3 % (ref 34.0–46.6)
HEMOGLOBIN: 13.3 g/dL (ref 11.1–15.9)
MCH: 30.4 pg (ref 26.6–33.0)
MCHC: 33 g/dL (ref 31.5–35.7)
MCV: 92 fL (ref 79–97)
Platelets: 345 10*3/uL (ref 150–450)
RBC: 4.37 x10E6/uL (ref 3.77–5.28)
RDW: 12.3 % (ref 11.7–15.4)
WBC: 10 10*3/uL (ref 3.4–10.8)

## 2018-10-14 ENCOUNTER — Telehealth: Payer: Self-pay | Admitting: Obstetrics and Gynecology

## 2018-10-14 NOTE — Telephone Encounter (Signed)
Phone call with Dr. Olena Heckle, patient's psychiatrist.   I discussed patient's bleeding profile on Mirena IUD and her desire for treatment to control this.  Patient has brittle bipolar disorder currently treated.   We discussed low dose transdermal estrogen for control of her bleeding, which may interact her her Lamictal.   Her psychiatrist will follow her for this and I said that I would do the same.   Will plan to start Minivelle 0.0375 mg twice weekly and then follow up in 6 weeks.  OK for Minivelle 0.0375 mg twice weekly.  Dispense:  8. RF 2.   I will have triage contact patient to start this prescription and arrange for follow up.

## 2018-10-15 MED ORDER — ESTRADIOL 0.0375 MG/24HR TD PTTW: 1.0000 | MEDICATED_PATCH | TRANSDERMAL | 2 refills | Status: DC

## 2018-10-15 NOTE — Telephone Encounter (Signed)
Spoke with patient, advised as seen below per Dr. Edward Jolly. Patient agreeable to plan, Rx for Minvelle to verified pharmacy. OV scheduled for 11/05/18 at 0900 with Dr. Edward Jolly. Patient will plan to have 3rd gardasil at this OV. Patient verbalizes understanding.   Routing to provider for final review. Patient is agreeable to disposition. Will close encounter.

## 2018-11-20 ENCOUNTER — Other Ambulatory Visit: Payer: Self-pay

## 2018-11-23 ENCOUNTER — Other Ambulatory Visit: Payer: Self-pay

## 2018-11-23 ENCOUNTER — Ambulatory Visit: Payer: BLUE CROSS/BLUE SHIELD

## 2018-11-23 ENCOUNTER — Ambulatory Visit (INDEPENDENT_AMBULATORY_CARE_PROVIDER_SITE_OTHER): Payer: BLUE CROSS/BLUE SHIELD | Admitting: Obstetrics and Gynecology

## 2018-11-23 DIAGNOSIS — Z975 Presence of (intrauterine) contraceptive device: Secondary | ICD-10-CM | POA: Diagnosis not present

## 2018-11-23 DIAGNOSIS — N921 Excessive and frequent menstruation with irregular cycle: Secondary | ICD-10-CM

## 2018-11-23 MED ORDER — ESTRADIOL 0.0375 MG/24HR TD PTTW: 1.0000 | MEDICATED_PATCH | TRANSDERMAL | 1 refills | Status: DC

## 2018-11-23 NOTE — Progress Notes (Signed)
GYNECOLOGY  VISIT   HPI: 43 y.o.   Single  Caucasian  female   G0P0000 with No LMP recorded.   here for WebEx for breakthrough bleeding with Mirena IUD.  She gives permission to do WebEx today.  She is at home, I am at the office.  Web Visit organized [employer].  Started at 10:23.  Ended at 10:37.  Bleeding with Mirena IUD for 10 - 14 days at a time.  IUD placed 06/17/18.   Using Vivelle Dot twice weekly to control the bleeding.   Her menses are 28 days apart.  The first month, she had a 4 day menses and light.  This last menses was a little heavier and lasting 5 days.  Starting to taper off.  Pad change this cycle every couple of hours on the heaviest day for 2 - 2.5 days, otherwise changing panty liner 2 - 3 times a day.  She does states she has had some spotting on Saturdays.  She is using the patch every 3 - 4 days.   States that her pain was not bad with her cycles.  Felt a little bit more crampy with her cycle with the use of the estrogen patch.  Advil helps the pain.   No nausea or skin reaction.   States she is really happy lately and productive.  She is writing a novel.  GYNECOLOGIC HISTORY: No LMP recorded. Contraception:  Mirena.  Menopausal hormone therapy:  NA.  Last mammogram:  07/03/18 - BI-RADS1.  Last pap smear:  12/2015 - normal.        OB History    Gravida  0   Para  0   Term  0   Preterm  0   AB  0   Living  0     SAB  0   TAB  0   Ectopic  0   Multiple  0   Live Births  0              Patient Active Problem List   Diagnosis Date Noted  . Choledocholithiasis 04/29/2018  . Bipolar disorder in full remission (HCC)   . Uncomplicated asthma   . Hypothyroidism   . Cholecystitis 03/23/2017    Past Medical History:  Diagnosis Date  . Anemia   . Anxiety   . Asthma   . Bipolar 1 disorder (HCC)   . Depression   . Meniere's disease   . Thyroid disease    hashimoto     Past Surgical History:  Procedure Laterality  Date  . CHOLECYSTECTOMY N/A 03/23/2017   Procedure: LAPAROSCOPIC CHOLECYSTECTOMY, W/ POSS IOC;  Surgeon: Harriette Bouillon, MD;  Location: [facility] OR;  Service: General;  Laterality: N/A;  . ERCP N/A 05/01/2018   Procedure: ENDOSCOPIC RETROGRADE CHOLANGIOPANCREATOGRAPHY (ERCP);  Surgeon: Vida Rigger, MD;  Location: [facility] ENDOSCOPY;  Service: Endoscopy;  Laterality: N/A;  . REMOVAL OF STONES  05/01/2018   Procedure: REMOVAL OF STONES;  Surgeon: Vida Rigger, MD;  Location: [facility] ENDOSCOPY;  Service: Endoscopy;;  . Dennison Mascot  05/01/2018   Procedure: Dennison Mascot;  Surgeon: Vida Rigger, MD;  Location: [facility] ENDOSCOPY;  Service: Endoscopy;;    Current Outpatient Medications  Medication Sig Dispense Refill  . acyclovir ointment (ZOVIRAX) 5 % Apply 1 application topically See admin instructions. Apply to affected areas every 4 hours as needed for fever blisters- for up to 5 days  2  . ADVAIR DISKUS 250-50 MCG/DOSE AEPB Inhale 1 puff into the lungs 2 (two)  times daily.  3  . albuterol (PROVENTIL HFA;VENTOLIN HFA) 108 (90 Base) MCG/ACT inhaler Inhale 1-2 puffs into the lungs every 6 (six) hours as needed for wheezing or shortness of breath.    . ALPRAZolam (XANAX) 0.5 MG tablet Take 0.25-0.5 mg by mouth daily as needed for anxiety.   1  . ARIPiprazole (ABILIFY) 20 MG tablet TK 1 T PO ONCE D    . cetirizine (ZYRTEC) 10 MG tablet Take 10 mg by mouth daily as needed for allergies.    Marland Kitchen. DECARA 1610950000 units capsule Take 50,000 Units by mouth every Sunday.  2  . estradiol (MINIVELLE) 0.0375 MG/24HR Place 1 patch onto the skin 2 (two) times a week. 8 patch 2  . Fluocinonide 0.1 % CREA Apply 1 application topically daily as needed (for rashes on arms).   2  . fluticasone (FLONASE) 50 MCG/ACT nasal spray Place 1-2 sprays into both nostrils 2 (two) times daily as needed for allergies or rhinitis (for seasonal allergies).    . hydrochlorothiazide (HYDRODIURIL) 25 MG tablet Take 25 mg by mouth every morning.  9  . lamoTRIgine  (LAMICTAL) 200 MG tablet Take 400 mg by mouth at bedtime.   2  . levonorgestrel (MIRENA) 20 MCG/24HR IUD by Intrauterine route.    Marland Kitchen. levothyroxine (SYNTHROID, LEVOTHROID) 125 MCG tablet Take 125 mcg by mouth 2 (two) times daily.    Marland Kitchen. lithium carbonate (ESKALITH) 450 MG CR tablet Take 900 mg by mouth at bedtime.   1  . montelukast (SINGULAIR) 10 MG tablet Take 10 mg by mouth daily.  3  . valACYclovir (VALTREX) 1000 MG tablet Take 2,000 mg by mouth 2 (two) times daily as needed (for fever blisters- for 1 day, as directed).      No current facility-administered medications for this visit.      ALLERGIES: Codeine and Latex  Family History  Problem Relation Age of Onset  . Diabetes Father   . Hypertension Father   . Hypertension Maternal Grandfather     Social History   Socioeconomic History  . Marital status: Single    Spouse name: Not on file  . Number of children: Not on file  . Years of education: Not on file  . Highest education level: Not on file  Occupational History  . Not on file  Social Needs  . Financial resource strain: Not on file  . Food insecurity:    Worry: Not on file    Inability: Not on file  . Transportation needs:    Medical: Not on file    Non-medical: Not on file  Tobacco Use  . Smoking status: Never Smoker  . Smokeless tobacco: Never Used  Substance and Sexual Activity  . Alcohol use: No  . Drug use: No  . Sexual activity: Not Currently    Birth control/protection: Abstinence  Lifestyle  . Physical activity:    Days per week: Not on file    Minutes per session: Not on file  . Stress: Not on file  Relationships  . Social connections:    Talks on phone: Not on file    Gets together: Not on file    Attends religious service: Not on file    Active member of club or organization: Not on file    Attends meetings of clubs or organizations: Not on file    Relationship status: Not on file  . Intimate partner violence:    Fear of current or ex  partner: Not on file  Emotionally abused: Not on file    Physically abused: Not on file    Forced sexual activity: Not on file  Other Topics Concern  . Not on file  Social History Narrative  . Not on file    Review of Systems  See above.   PHYSICAL EXAMINATION:    There were no vitals taken for this visit.    General appearance: alert, cooperative and appears stated age  ASSESSMENT  Irregular bleeding with Mirena IUD.  Improved with transdermal estrogen.  Bipolar.  Stable.   PLAN  Refills of Vivelle Dot 0.05 mg twice weekly until annual exam is due.  Will do third Gardasil in October, 2020. AEX in October, 2020.    An After Visit Summary was printed and given to the patient.  __14____ minutes face to face time of which over 50% was spent in counseling.

## 2018-11-24 ENCOUNTER — Encounter: Payer: Self-pay | Admitting: Obstetrics and Gynecology

## 2018-11-25 ENCOUNTER — Ambulatory Visit: Payer: Self-pay | Admitting: Obstetrics and Gynecology

## 2019-01-18 ENCOUNTER — Encounter: Payer: Self-pay | Admitting: Obstetrics and Gynecology

## 2019-01-18 ENCOUNTER — Telehealth: Payer: Self-pay | Admitting: Obstetrics and Gynecology

## 2019-01-18 DIAGNOSIS — N921 Excessive and frequent menstruation with irregular cycle: Secondary | ICD-10-CM

## 2019-01-18 NOTE — Telephone Encounter (Signed)
I would recommend an office visit for ultrasound sound and appointment with me.  I would like to do some blood work to check her hemoglobin and iron levels.  It is possible she actually needs more estrogen to help control her bleeding, but I will have a better idea of her clinical picture after she has an ultrasound.

## 2019-01-18 NOTE — Telephone Encounter (Signed)
Spoke with patient. She has been having breakthrough bleeding with Mirena IUD. Patient states LMP 01-09-19 and still bleeding--wearing regular pad and changes 4-5x/day. Her PMP 12-10-18 she bled for 13 days, and 10 our 13 were fairly heavy/no clots--changing regular pad 5x/day probably. She is still taking Vivelle dot 0.05mg  2x/week and feels it is not helping. She does feel tired and lightheaded sometimes, but states her bipolar makes her feel this way. Patient requesting to stop Vivelle and for further recommendations. Routed to provider.

## 2019-01-18 NOTE — Telephone Encounter (Signed)
Message  Hi Dr. Quincy Simmonds,    We met a month or two ago about my bleeding and pain. I am on low-dose estrogen in hopes of controlling bleeding. At the time we met, I had stopped for the month. It started up again and went for 4-5 more days. Last month I bled 13 days. This month it's been 10 and I'm still going. Can I just stop the estrogen, since it's clearly not helping?      Thanks,    [employer]

## 2019-01-18 NOTE — Telephone Encounter (Signed)
Spoke with patient. Advised Dr.Silva recommends PUS and office visit. Scheduled PUS/consult 01-21-19 10:30am with Dr.Silva. Sent precertification to [place].

## 2019-01-19 ENCOUNTER — Other Ambulatory Visit: Payer: Self-pay

## 2019-01-21 ENCOUNTER — Encounter: Payer: Self-pay | Admitting: Obstetrics and Gynecology

## 2019-01-21 ENCOUNTER — Ambulatory Visit (INDEPENDENT_AMBULATORY_CARE_PROVIDER_SITE_OTHER): Payer: BC Managed Care – PPO

## 2019-01-21 ENCOUNTER — Other Ambulatory Visit: Payer: Self-pay

## 2019-01-21 ENCOUNTER — Other Ambulatory Visit: Payer: BLUE CROSS/BLUE SHIELD

## 2019-01-21 ENCOUNTER — Ambulatory Visit: Payer: BC Managed Care – PPO | Admitting: Obstetrics and Gynecology

## 2019-01-21 VITALS — BP 112/68 | HR 84 | Temp 98.4°F | Resp 14 | Ht 66.0 in | Wt 216.0 lb

## 2019-01-21 DIAGNOSIS — Z975 Presence of (intrauterine) contraceptive device: Secondary | ICD-10-CM

## 2019-01-21 DIAGNOSIS — N946 Dysmenorrhea, unspecified: Secondary | ICD-10-CM | POA: Diagnosis not present

## 2019-01-21 DIAGNOSIS — Z30431 Encounter for routine checking of intrauterine contraceptive device: Secondary | ICD-10-CM | POA: Diagnosis not present

## 2019-01-21 DIAGNOSIS — N921 Excessive and frequent menstruation with irregular cycle: Secondary | ICD-10-CM | POA: Diagnosis not present

## 2019-01-21 DIAGNOSIS — N926 Irregular menstruation, unspecified: Secondary | ICD-10-CM

## 2019-01-21 NOTE — Progress Notes (Signed)
GYNECOLOGY  VISIT   HPI: 43 y.o.   Single  Caucasian  female   G0P0000 with Patient's last menstrual period was 01/09/2019.   here for ultrasound due to irregular uterine bleeding with Mirena IUD.     Patient had Mirena IUD placed in Nov. 2019 for heavy and painful menses.  Her menses were regular prior to IUD placement.  Patient states she cannot take NSAIDS due to lithium use.  Prior CT suggested a uterine fibroid.  Her pelvic US done 06/04/18 prior to IUD placement did not document a fibroid.  Patient was place on low dose transestrogen due to her breakthrough bleeding with Mirena.  Currently she needs a pad or a liner for 1/2 the month due to bleeding.  Bleeding may stop for a day, and then the bleeding recurs, combined with pain.  Her bleeding is not as intense with the IUD, but is spread over several days.  She is not changing her pad every hour the way she was prior to the IUD.  The pain has decreased overall with the IUD.  The pain is controlled now with a couple of days of Aleve.  She does say that her menses are miserable.   She has been on Minivelle 0.0375 mg twice weekly.  She can feel more down and moody on the transdermal estrogen.  She does have bipolar and the estrogen may interact with her Lamictal.  I did have a conversation with her psychiatrist prior to starting the estrogen.   She declines future childbearing.  She is not sexually active.   Her goal is to not disturb her bipolar disorder.   Used Nortrel in the past.  She states this helped her mood in the past.  Denies hx of smoking, HTN, migraine with aura, liver or breast disease, personal hx of blood clots.   She has headaches with a blurriness of vision during HA.  No hx migraine with aura.   GYNECOLOGIC HISTORY: Patient's last menstrual period was 01/09/2019. Contraception:  IUD - placed 06/17/18.  Menopausal hormone therapy:  Minivelle patch Last mammogram:  07/03/18 - BI-RADS1 Last pap smear:    12/2015 - normal        OB History    Gravida  0   Para  0   Term  0   Preterm  0   AB  0   Living  0     SAB  0   TAB  0   Ectopic  0   Multiple  0   Live Births  0              Patient Active Problem List   Diagnosis Date Noted  . Choledocholithiasis 04/29/2018  . Bipolar disorder in full remission (HCC)   . Uncomplicated asthma   . Hypothyroidism   . Cholecystitis 03/23/2017    Past Medical History:  Diagnosis Date  . Anemia   . Anxiety   . Asthma   . Bipolar 1 disorder (HCC)   . Depression   . Meniere's disease   . Thyroid disease    hashimoto     Past Surgical History:  Procedure Laterality Date  . CHOLECYSTECTOMY N/A 03/23/2017   Procedure: LAPAROSCOPIC CHOLECYSTECTOMY, W/ POSS IOC;  Surgeon: Harriette Bouillonornett, Thomas, MD;  Location: [facility] OR;  Service: General;  Laterality: N/A;  . ERCP N/A 05/01/2018   Procedure: ENDOSCOPIC RETROGRADE CHOLANGIOPANCREATOGRAPHY (ERCP);  Surgeon: Vida RiggerMagod, Marc, MD;  [facility];  Service: Endoscopy;  Laterality: N/A;  .  REMOVAL OF STONES  05/01/2018   Procedure: REMOVAL OF STONES;  Surgeon: Clarene Essex, MD;  Location: [facility] ENDOSCOPY;  Service: Endoscopy;;  . Joan Mayans  05/01/2018   Procedure: Joan Mayans;  Surgeon: Clarene Essex, MD;  Location: [facility] ENDOSCOPY;  Service: Endoscopy;;    Current Outpatient Medications  Medication Sig Dispense Refill  . acyclovir ointment (ZOVIRAX) 5 % Apply 1 application topically See admin instructions. Apply to affected areas every 4 hours as needed for fever blisters- for up to 5 days  2  . ADVAIR DISKUS 250-50 MCG/DOSE AEPB Inhale 1 puff into the lungs 2 (two) times daily.  3  . albuterol (PROVENTIL HFA;VENTOLIN HFA) 108 (90 Base) MCG/ACT inhaler Inhale 1-2 puffs into the lungs every 6 (six) hours as needed for wheezing or shortness of breath.    . ALPRAZolam (XANAX) 0.5 MG tablet Take 0.25-0.5 mg by mouth daily as needed for anxiety.   1  . ARIPiprazole (ABILIFY) 20 MG tablet TK 1  T PO ONCE D    . cetirizine (ZYRTEC) 10 MG tablet Take 10 mg by mouth daily as needed for allergies.    Marland Kitchen DECARA 93790 units capsule Take 50,000 Units by mouth every Sunday.  2  . estradiol (MINIVELLE) 0.0375 MG/24HR Place 1 patch onto the skin 2 (two) times a week. 12 patch 1  . Fluocinonide 0.1 % CREA Apply 1 application topically daily as needed (for rashes on arms).   2  . fluticasone (FLONASE) 50 MCG/ACT nasal spray Place 1-2 sprays into both nostrils 2 (two) times daily as needed for allergies or rhinitis (for seasonal allergies).    . hydrochlorothiazide (HYDRODIURIL) 25 MG tablet Take 25 mg by mouth every morning.  9  . lamoTRIgine (LAMICTAL) 200 MG tablet Take 400 mg by mouth at bedtime.   2  . levonorgestrel (MIRENA) 20 MCG/24HR IUD by Intrauterine route.    Marland Kitchen levothyroxine (SYNTHROID) 200 MCG tablet Take 200 mcg by mouth 2 (two) times daily.     Marland Kitchen lithium carbonate (ESKALITH) 450 MG CR tablet Take 900 mg by mouth at bedtime.   1  . montelukast (SINGULAIR) 10 MG tablet Take 10 mg by mouth daily.  3  . valACYclovir (VALTREX) 1000 MG tablet Take 2,000 mg by mouth 2 (two) times daily as needed (for fever blisters- for 1 day, as directed).      No current facility-administered medications for this visit.      ALLERGIES: Codeine and Latex  Family History  Problem Relation Age of Onset  . Diabetes Father   . Hypertension Father   . Hypertension Maternal Grandfather     Social History   Socioeconomic History  . Marital status: Single    Spouse name: Not on file  . Number of children: Not on file  . Years of education: Not on file  . Highest education level: Not on file  Occupational History  . Not on file  Social Needs  . Financial resource strain: Not on file  . Food insecurity    Worry: Not on file    Inability: Not on file  . Transportation needs    Medical: Not on file    Non-medical: Not on file  Tobacco Use  . Smoking status: Never Smoker  . Smokeless tobacco:  Never Used  Substance and Sexual Activity  . Alcohol use: No  . Drug use: No  . Sexual activity: Not Currently    Birth control/protection: Abstinence  Lifestyle  . Physical activity  Days per week: Not on file    Minutes per session: Not on file  . Stress: Not on file  Relationships  . Social Musicianconnections    Talks on phone: Not on file    Gets together: Not on file    Attends religious service: Not on file    Active member of club or organization: Not on file    Attends meetings of clubs or organizations: Not on file    Relationship status: Not on file  . Intimate partner violence    Fear of current or ex partner: Not on file    Emotionally abused: Not on file    Physically abused: Not on file    Forced sexual activity: Not on file  Other Topics Concern  . Not on file  Social History Narrative  . Not on file    Review of Systems  Constitutional: Negative.   HENT: Negative.   Eyes: Negative.   Respiratory: Negative.   Cardiovascular: Negative.   Gastrointestinal: Negative.   Endocrine: Negative.   Genitourinary: Positive for vaginal bleeding.       Cramping   Musculoskeletal: Negative.   Skin: Negative.   Allergic/Immunologic: Negative.   Neurological: Negative.   Hematological: Negative.   Psychiatric/Behavioral: Negative.     PHYSICAL EXAMINATION:    BP 112/68 (BP Location: Left Arm, Patient Position: Sitting, Cuff Size: Large)   Pulse 84   Temp 98.4 F (36.9 C) (Temporal)   Resp 14   Ht 5\' 6"  (1.676 m)   Wt 216 lb (98 kg)   LMP 01/09/2019   BMI 34.86 kg/m     General appearance: alert, cooperative and appears stated age  Pelvic US Uterus with no masses.  IUD in proper position in endometrial canal. EMS 7.21. Normal ovaries.  No free fluid.  ASSESSMENT  Dysmenorrhea.  Irregular vaginal bleeding.  Mirena IUD Bipolar disorder.  HAs.  No history of migraine or migraine with aura.   PLAN  We reviewed the pelvic US findings and reassurance  regarding the IUD position.  We discussed possible reasons for the irregular bleeding - the IUD itself, endometriosis, inadequate estrogen supplementation.  I doubt infection as the cause. Consider increasing dose of transdermal estrogen patch, adding low dose OCPs, endometrial  ablation/salpingectomy, laparoscopic hysterectomy.  We reviewed the risks and benefits of these options in detail. She will consider her options and consult with her psychiatrist.  She will contact me back with her choice.   An After Visit Summary was printed and given to the patient.  ___40___ minutes face to face time of which over 50% was spent in counseling.

## 2019-01-21 NOTE — Progress Notes (Signed)
Encounter reviewed by Dr. Brook Amundson C. Silva.  

## 2019-01-26 ENCOUNTER — Encounter: Payer: Self-pay | Admitting: Obstetrics and Gynecology

## 2019-01-27 ENCOUNTER — Telehealth: Payer: Self-pay | Admitting: Obstetrics and Gynecology

## 2019-01-27 MED ORDER — NORETHIN-ETH ESTRAD-FE BIPHAS 1 MG-10 MCG / 10 MCG PO TABS: 1.0000 | ORAL_TABLET | Freq: Every day | ORAL | 0 refills | Status: DC

## 2019-01-27 NOTE — Telephone Encounter (Signed)
Patient sent the following message through [place]. Routing to triage to assist patient with request.   Hi, Dr. Quincy Simmonds,    I talked with my psychiatrist, and we agree that trying birth control pills would be a good option before resorting to surgery for my sustained bleeding. Could you please send a prescription to [facility] on [address])? Or, please advise if you think there's something else I should do.      Thank you,    Jenna Navarro

## 2019-01-27 NOTE — Telephone Encounter (Signed)
I recommend LoLoestrin.  Please send 3 packs to her pharmacy and schedule an in office recheck in 10 weeks with me.  Thanks.

## 2019-01-27 NOTE — Telephone Encounter (Signed)
Spoke with patient. Notified Dr.Silva wants her to begin on Loloestrin and will send Rx to pharmacy. Made 3 mo. follow up appointment for 04-20-19 3:30pm.

## 2019-01-27 NOTE — Telephone Encounter (Signed)
Routing to [place] for Rx.

## 2019-02-16 ENCOUNTER — Telehealth: Payer: Self-pay | Admitting: Obstetrics and Gynecology

## 2019-02-16 ENCOUNTER — Encounter: Payer: Self-pay | Admitting: Obstetrics and Gynecology

## 2019-02-16 NOTE — Telephone Encounter (Signed)
Patient sent the following correspondence through [place]. Routing to triage to assist patient with request.  Hi, Dr. Quincy Simmonds,    I haven't started the pills yet because my pharmacist told me to start them on the Sunday following my last period. I've had my period on and off since the 10th, and I haven't known when to start the pill.    It has become clear to me that I may be having 2 periods back-to-back rather than 1 long period. It comes, goes away, and comes back after maybe 2-3 days. Both times, it's with the pattern of light flow -->heavy flow -->light flow. Then it starts again after 2 weeks. Could I be having 3 periods per month?    Any thoughts would be appreciated. Please let me know, too, whether to start my pills when my period stops or when my period SHOULD HAVE stopped.      Thank you,    Jenna Navarro

## 2019-02-16 NOTE — Telephone Encounter (Signed)
Spoke with patient, advised as seen below per Dr. Silva.  Patient verbalizes understanding and is agreeable.  Encounter closed.  

## 2019-02-16 NOTE — Telephone Encounter (Signed)
Please have her start her birth control pills now.  No reason to wait.  Have her keep her follow up appointment.

## 2019-02-16 NOTE — Telephone Encounter (Signed)
Patient seen in office on 01/21/19 for dysmenorrhea and irregular menses. Mirena IUD in place. Rx for LoLoestrin sent on 01/27/19.   Dr. Quincy Simmonds- ok to start OCP now since Mirena IUD in place. Please review patient MyChart message.

## 2019-04-06 ENCOUNTER — Telehealth: Payer: Self-pay | Admitting: Obstetrics and Gynecology

## 2019-04-06 ENCOUNTER — Encounter: Payer: Self-pay | Admitting: Obstetrics and Gynecology

## 2019-04-06 DIAGNOSIS — Z30432 Encounter for removal of intrauterine contraceptive device: Secondary | ICD-10-CM

## 2019-04-06 NOTE — Telephone Encounter (Signed)
Routing to Dr. Quincy Simmonds to advise in IUD removal.

## 2019-04-06 NOTE — Telephone Encounter (Signed)
Spoke with patient. Advised per Dr. Quincy Simmonds. Patient request to schedule consult to discuss possible IUD removal, scheduled for 9/14 at 8am. Patient is aware this appt is not for the IUD removal, just consult, is requesting to review benefits for removal should she want to proceed, order placed for precert. Questions answered. Patient verbalizes understanding.   Routing to provider for final review. Patient is agreeable to disposition. Will close encounter.

## 2019-04-06 NOTE — Telephone Encounter (Signed)
Routing to [employer]  and [employer].

## 2019-04-06 NOTE — Telephone Encounter (Signed)
Ok to stop the birth control pills.  These were prescribed to try to control irregular bleeding with her IUD.   It will be her choice if she wants me to remove the IUD.  This was placed for painful and heavy menstruation, so she will need to be prepared for this to potentially return if the IUD is removed. It contributes a little bit to systemic progesterone levels.   Does she want to come in for an appointment to discuss the IUD?

## 2019-04-06 NOTE — Telephone Encounter (Signed)
Patient sent the following message through [place]. Routing to triage to assist patient with request.  [facility]  Phone Number: [phone]        [facility] Dr. Quincy Simmonds,   Since taking the birth control pills, I became extremely depressed to the point where I was wishing I were dead (though not suicidal). My psychiatrist has recommended I come off of the bc as soon as possible. I am just finishing a period (I think--I never can tell) and have completed my second pack. I think it's best I not start the third.   Also, I think it would be smart to get the IUD out. What are your thoughts?   The birth control pill did work for regulating my cycle. It's a shame.   Thank you,   Jenna Navarro

## 2019-04-07 ENCOUNTER — Telehealth: Payer: Self-pay | Admitting: Obstetrics and Gynecology

## 2019-04-07 NOTE — Telephone Encounter (Signed)
Call laced to convey benefits for iud removal.

## 2019-04-08 NOTE — Progress Notes (Signed)
GYNECOLOGY  VISIT   HPI: 43 y.o.   Single  Caucasian  female   G0P0000 with Patient's last menstrual period was 04/03/2019 (exact date).   here for consult regarding possible IUD removal.   She has a Mirena for heavy menses and horrible pain every 2 months.  She takes Tylenol and Ibuprofen for a couple of days at a time.  She has been told to use limited Ibuprofen due to her daily use of Lithium.   She has been taking COCs to control bleeding with the Mirena.  She developed depression, and she consulted with her psychiatrist.  Everyone agreed to stop her COCs.   She declines childbearing.   She is considering hysterectomy.   GYNECOLOGIC HISTORY: Patient's last menstrual period was 04/03/2019 (exact date). Contraception:  Mirena IUD 05/2018/OCP Menopausal hormone therapy:none Last mammogram: 07/03/18 - BI-RADS1 Last pap smear:  12/2015 - normal        OB History    Gravida  0   Para  0   Term  0   Preterm  0   AB  0   Living  0     SAB  0   TAB  0   Ectopic  0   Multiple  0   Live Births  0              Patient Active Problem List   Diagnosis Date Noted  . Choledocholithiasis 04/29/2018  . Bipolar disorder in full remission (HCC)   . Uncomplicated asthma   . Hypothyroidism   . Cholecystitis 03/23/2017    Past Medical History:  Diagnosis Date  . Anemia   . Anxiety   . Asthma   . Bipolar 1 disorder (HCC)   . Depression   . Meniere's disease   . Thyroid disease    hashimoto     Past Surgical History:  Procedure Laterality Date  . CHOLECYSTECTOMY N/A 03/23/2017   Procedure: LAPAROSCOPIC CHOLECYSTECTOMY, W/ POSS IOC;  Surgeon: Harriette Bouillon, MD;  Location: [facility] OR;  Service: General;  Laterality: N/A;  . ERCP N/A 05/01/2018   Procedure: ENDOSCOPIC RETROGRADE CHOLANGIOPANCREATOGRAPHY (ERCP);  Surgeon: Vida Rigger, MD;  Location: [facility] ENDOSCOPY;  Service: Endoscopy;  Laterality: N/A;  . REMOVAL OF STONES  05/01/2018   Procedure: REMOVAL OF STONES;   Surgeon: Vida Rigger, MD;  Location: [facility] ENDOSCOPY;  Service: Endoscopy;;  . Dennison Mascot  05/01/2018   Procedure: Dennison Mascot;  Surgeon: Vida Rigger, MD;  Location: [facility] ENDOSCOPY;  Service: Endoscopy;;    Current Outpatient Medications  Medication Sig Dispense Refill  . acyclovir ointment (ZOVIRAX) 5 % Apply 1 application topically See admin instructions. Apply to affected areas every 4 hours as needed for fever blisters- for up to 5 days  2  . ADVAIR HFA 230-21 MCG/ACT inhaler Inhale 1 puff into the lungs 2 (two) times daily.    Marland Kitchen albuterol (PROVENTIL HFA;VENTOLIN HFA) 108 (90 Base) MCG/ACT inhaler Inhale 1-2 puffs into the lungs every 6 (six) hours as needed for wheezing or shortness of breath.    . ALPRAZolam (XANAX) 0.5 MG tablet Take 0.25-0.5 mg by mouth daily as needed for anxiety.   1  . azelastine (ASTELIN) 0.1 % nasal spray Place 1 spray into both nostrils as needed.    . cetirizine (ZYRTEC) 10 MG tablet Take 10 mg by mouth daily as needed for allergies.    Marland Kitchen DECARA 58592 units capsule Take 50,000 Units by mouth every Sunday.  2  . Fluocinonide 0.1 % CREA  Apply 1 application topically daily as needed (for rashes on arms).   2  . fluticasone (FLONASE) 50 MCG/ACT nasal spray Place 1-2 sprays into both nostrils 2 (two) times daily as needed for allergies or rhinitis (for seasonal allergies).    . hydrochlorothiazide (HYDRODIURIL) 25 MG tablet Take 25 mg by mouth every morning.  9  . lamoTRIgine (LAMICTAL) 200 MG tablet Take 400 mg by mouth at bedtime.   2  . LATUDA 20 MG TABS tablet Take 1 tablet by mouth daily.    Marland Kitchen levocetirizine (XYZAL) 5 MG tablet Take 1 tablet by mouth daily.    Marland Kitchen levonorgestrel (MIRENA) 20 MCG/24HR IUD by Intrauterine route.    . lithium carbonate (ESKALITH) 450 MG CR tablet Take 900 mg by mouth at bedtime.   1  . montelukast (SINGULAIR) 10 MG tablet Take 10 mg by mouth daily.  3  . valACYclovir (VALTREX) 1000 MG tablet Take 2,000 mg by mouth 2 (two) times daily  as needed (for fever blisters- for 1 day, as directed).     Marland Kitchen levothyroxine (SYNTHROID) 200 MCG tablet Take 200 mcg by mouth 2 (two) times daily.      No current facility-administered medications for this visit.      ALLERGIES: Codeine and Latex  Family History  Problem Relation Age of Onset  . Diabetes Father   . Hypertension Father   . Hypertension Maternal Grandfather     Social History   Socioeconomic History  . Marital status: Single    Spouse name: Not on file  . Number of children: Not on file  . Years of education: Not on file  . Highest education level: Not on file  Occupational History  . Not on file  Social Needs  . Financial resource strain: Not on file  . Food insecurity    Worry: Not on file    Inability: Not on file  . Transportation needs    Medical: Not on file    Non-medical: Not on file  Tobacco Use  . Smoking status: Never Smoker  . Smokeless tobacco: Never Used  Substance and Sexual Activity  . Alcohol use: No  . Drug use: No  . Sexual activity: Not Currently    Birth control/protection: Abstinence  Lifestyle  . Physical activity    Days per week: Not on file    Minutes per session: Not on file  . Stress: Not on file  Relationships  . Social Herbalist on phone: Not on file    Gets together: Not on file    Attends religious service: Not on file    Active member of club or organization: Not on file    Attends meetings of clubs or organizations: Not on file    Relationship status: Not on file  . Intimate partner violence    Fear of current or ex partner: Not on file    Emotionally abused: Not on file    Physically abused: Not on file    Forced sexual activity: Not on file  Other Topics Concern  . Not on file  Social History Narrative  . Not on file    Review of Systems  All other systems reviewed and are negative.   PHYSICAL EXAMINATION:    BP 124/72 (Cuff Size: Large)   Pulse 76   Temp (!) 97.2 F (36.2 C)  (Temporal)   Ht 5\' 5"  (1.651 m)   Wt 223 lb 12.8 oz (101.5 kg)   LMP  04/03/2019 (Exact Date)   BMI 37.24 kg/m     General appearance: alert, cooperative and appears stated age  Pelvic: External genitalia:  no lesions              Urethra:  normal appearing urethra with no masses, tenderness or lesions              Bartholins and Skenes: normal                 Vagina: normal appearing vagina with normal color and discharge, no lesions              Cervix: no lesions.  IUD strings noted.                Bimanual Exam:  Uterus:  normal size, contour, position, consistency, mobility, non-tender              Adnexa: no mass, fullness, tenderness            IUD removal.  Consent for IUD removal after risks and benefits reviewed.  Ring forceps used to IUD - intact, shown to patient, and discarded.  Chaperone was present for exam.  ASSESSMENT  Encounter for IUD removal. Hx menorrhagia.  Dysmenorrhea. Bipolar disorder.  PLAN  She understands she has no contraception now that the IUD is removed.  She will expect potential increased bleeding and cramping with her cycles.  We discussed NSAID use, Melven Sartoriusrilssa, Depo Lupron, endometrial ablation, and laparoscopic hysterectomy. ACOG HO on hysterectomy.  She would like to precert this.    An After Visit Summary was printed and given to the patient.  _25____ minutes face to face time of which over 50% was spent in counseling.

## 2019-04-08 NOTE — Telephone Encounter (Signed)
Returned call to patient. Reviewed benefit for scheduled IUD removal. Patient acknowledges understanding of information presented. Patient is scheduled 04/12/2019 with Dr Quincy Simmonds. Patient is aware of the appointment date, arrival time and cancellation policy. No further questions. Will close encounter

## 2019-04-09 ENCOUNTER — Other Ambulatory Visit: Payer: Self-pay

## 2019-04-12 ENCOUNTER — Encounter: Payer: Self-pay | Admitting: Obstetrics and Gynecology

## 2019-04-12 ENCOUNTER — Ambulatory Visit (INDEPENDENT_AMBULATORY_CARE_PROVIDER_SITE_OTHER): Payer: BC Managed Care – PPO | Admitting: Obstetrics and Gynecology

## 2019-04-12 ENCOUNTER — Other Ambulatory Visit: Payer: Self-pay

## 2019-04-12 VITALS — BP 124/72 | HR 76 | Temp 97.2°F | Ht 65.0 in | Wt 223.8 lb

## 2019-04-12 DIAGNOSIS — Z8742 Personal history of other diseases of the female genital tract: Secondary | ICD-10-CM | POA: Diagnosis not present

## 2019-04-12 DIAGNOSIS — Z30432 Encounter for removal of intrauterine contraceptive device: Secondary | ICD-10-CM | POA: Diagnosis not present

## 2019-04-12 DIAGNOSIS — N946 Dysmenorrhea, unspecified: Secondary | ICD-10-CM

## 2019-04-19 ENCOUNTER — Telehealth: Payer: Self-pay | Admitting: Obstetrics and Gynecology

## 2019-04-19 ENCOUNTER — Other Ambulatory Visit: Payer: Self-pay | Admitting: Obstetrics and Gynecology

## 2019-04-19 NOTE — Telephone Encounter (Signed)
Spoke with patient in regards to benefits for surgery. Patient acknowledged understanding of information presented. Patient aware this is professional benefit only. Patient aware once surgery has been scheduled. shewill be contacted by hospital for separate benefits. Patient advises she will call back to advise how she would lime to proceed   cc: Lamont Snowball, RN

## 2019-04-20 ENCOUNTER — Ambulatory Visit: Payer: BC Managed Care – PPO | Admitting: Obstetrics and Gynecology

## 2019-04-29 NOTE — Telephone Encounter (Signed)
Patient returned call. Patient advises she is not going to proceed with procedure at this time and does not have a timeframe in which she would consider proceeding.   Forwarding to Lamont Snowball, RN

## 2019-04-29 NOTE — Telephone Encounter (Signed)
Call placed to follow up in regard to plans for scheduling surgery. Left voicemail message requesting a return call   cc: Lamont Snowball, RN

## 2019-04-29 NOTE — Telephone Encounter (Signed)
Call back to patient. Left message calling to follow-up on alternatives to surgery.

## 2019-07-01 NOTE — Telephone Encounter (Signed)
Will close encounter and await patient plan to proceed.  Routing to provider for final review.

## 2020-04-25 IMAGING — MR MR ABDOMEN WO/W CM MRCP
12 of 20 series · 25 of 48 positions shown · IV contrast (multihance)
Comparison: 04/24/2018 CT abdomen/pelvis and abdominal sonogram.

CLINICAL DATA: Severe right upper quadrant abdominal pain, nausea
and vomiting. History of cholecystectomy in 7265. Abnormal liver
function tests.

EXAM:
MRI ABDOMEN WITHOUT AND WITH CONTRAST (INCLUDING MRCP)
TECHNIQUE: Multiplanar multisequence MR imaging of the abdomen was performed
both before and after the administration of intravenous contrast.
Heavily T2-weighted images of the biliary and pancreatic ducts were
obtained, and three-dimensional MRCP images were rendered by post
processing.
CONTRAST:  18mL MULTIHANCE GADOBENATE DIMEGLUMINE 529 MG/ML IV SOLN

[Series 3: cor haste · coronal · 5.0mm · 0.74mm/px · 2 of 34 slices shown]
[im 1/34]
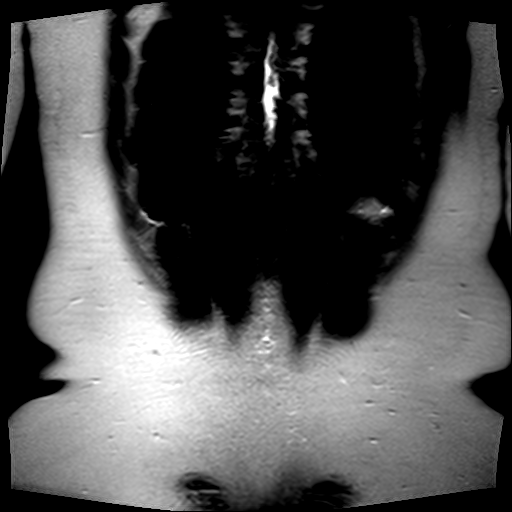
[im 34/34]
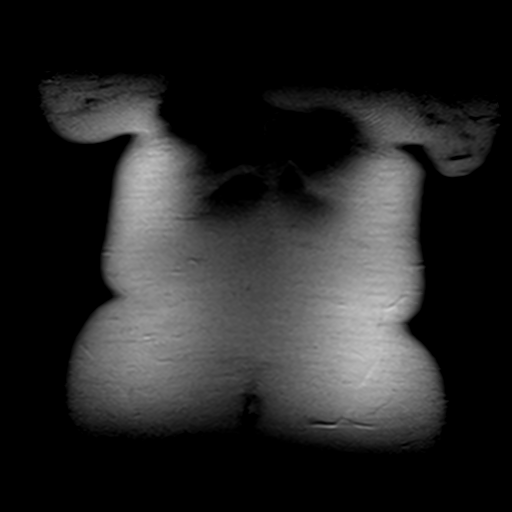

[Series 4: bSSFP · coronal · 5.5mm · 0.78mm/px · 2 of 30 slices shown (1 of 2)]
[im 1/30]
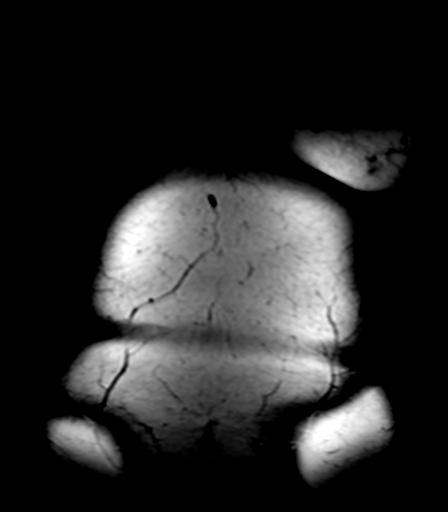
[im 30/30]
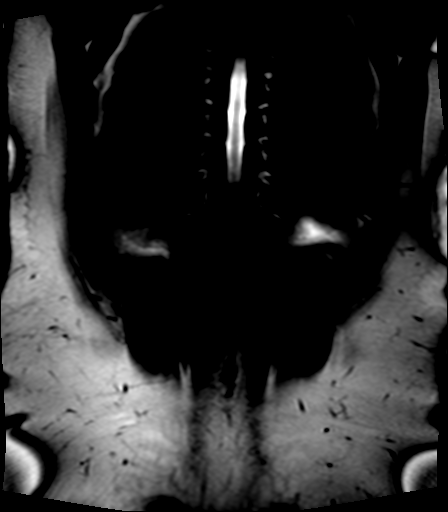

[Series 5: T2 · coronal · 3.0mm · 0.70mm/px · 2 of 48 slices shown (1 of 2)]
[im 1/48]
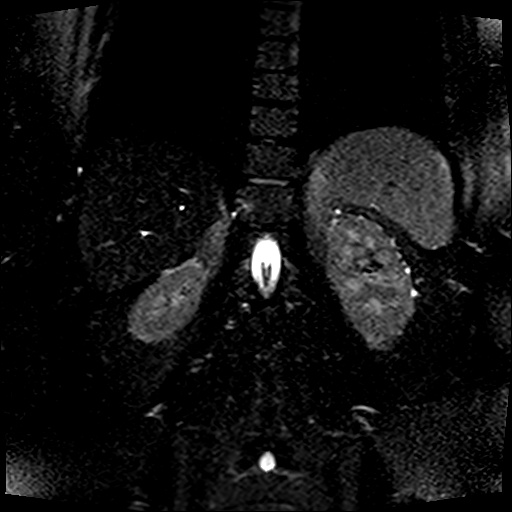
[im 48/48]
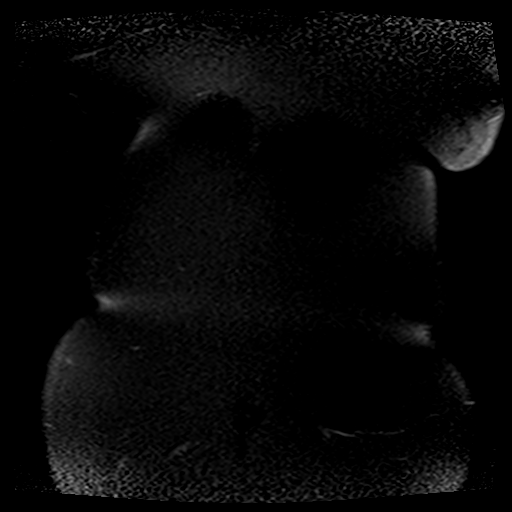

[Series 10: T1 · axial · 6.0mm · 0.74mm/px · z∈[-27,+164]mm · 2 of 60 slices shown]
[im 1/60]
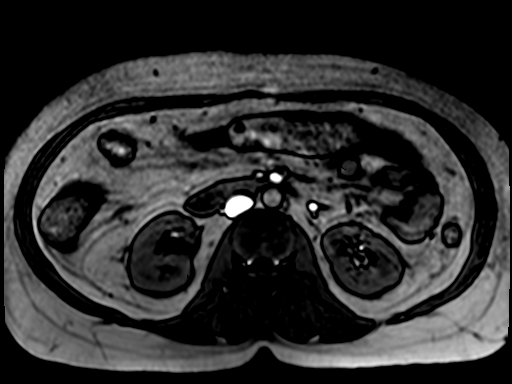
[im 60/60]
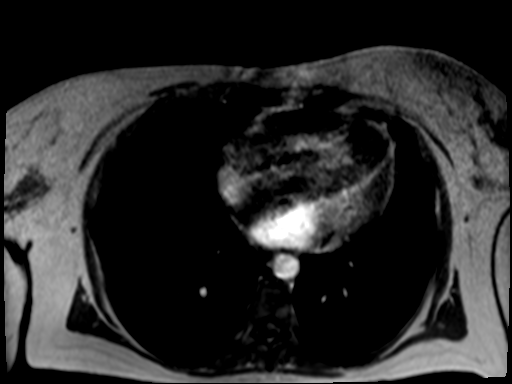

[Series 11: axial haste · axial · 6.0mm · 0.74mm/px · 1 of 33 slices shown]
[im 1/33]
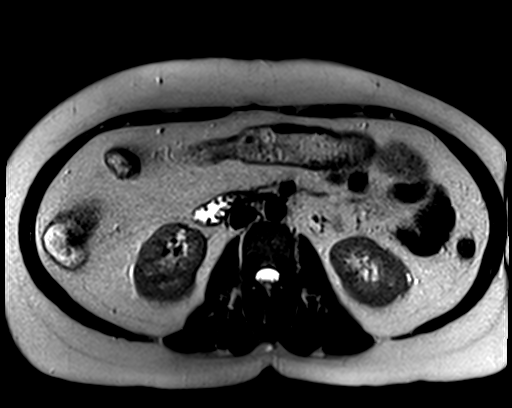

[Series 12: T2 · axial · 6.0mm · 1.12mm/px · 1 of 33 slices shown (2 of 2)]
[im 1/33]
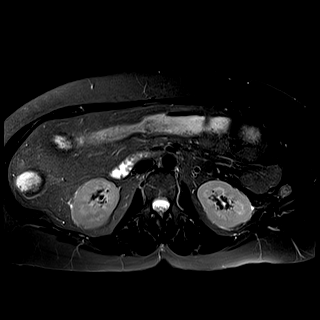

[Series 13: ep2d_[id number]_[id number]_trig · axial · 6.0mm · 1.98mm/px · z∈[-2,+207]mm · 3 of 90 slices shown]
[im 1/90]
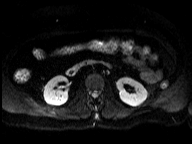
[im 45/90]
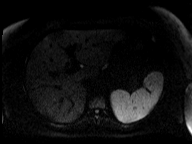
[im 90/90]
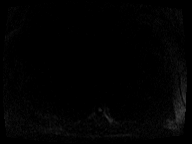

[Series 14: [id number]_[id number]_trig_adc · axial · 6.0mm · 1.98mm/px · 1 of 30 slices shown]
[im 1/30]
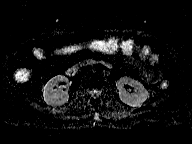

[Series 15: bSSFP · axial · 5.0mm · 0.74mm/px · z∈[-30,+175]mm · 2 of 42 slices shown (2 of 2)]
[im 1/42]
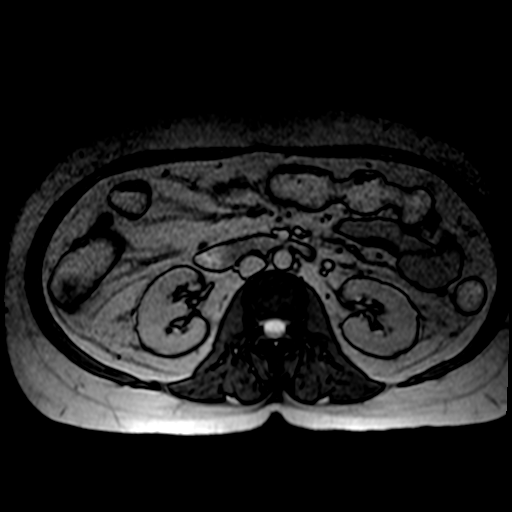
[im 42/42]
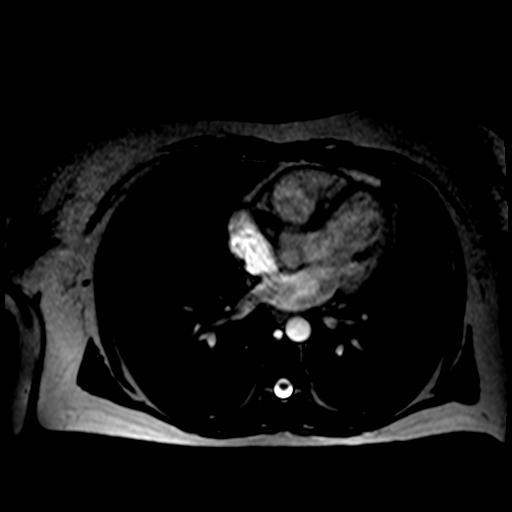

[Series 16: T1 dynamic · axial · non-contrast · 2.5mm · 0.74mm/px · z∈[-33,+165]mm · 3 of 80 slices shown]
[im 1/80]
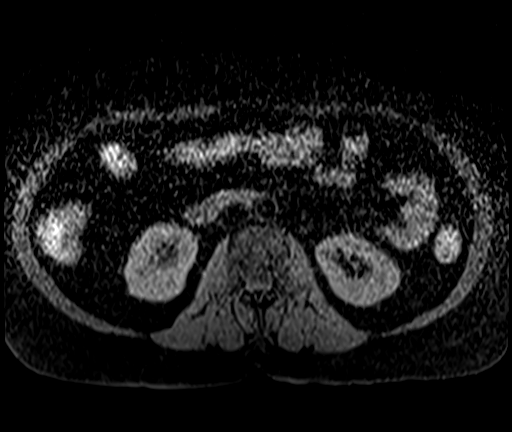
[im 40/80]
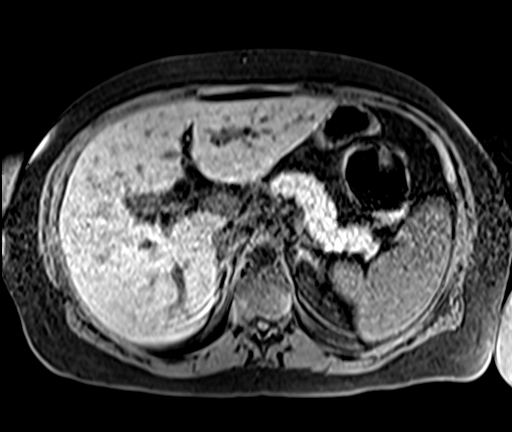
[im 80/80]
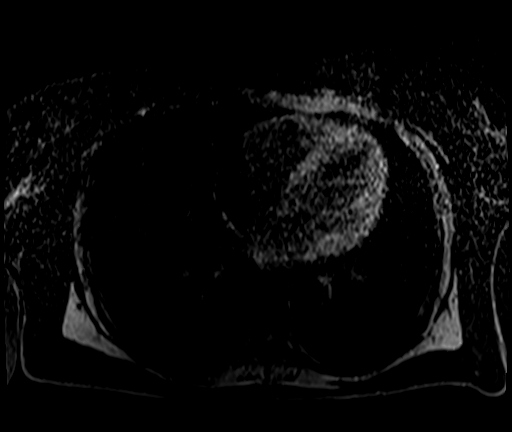

[Series 17: T1 dynamic post-contrast · axial · 2.5mm · 0.74mm/px · z∈[-33,+165]mm · 3 of 80 slices shown (1 of 2)]
[im 1/80]
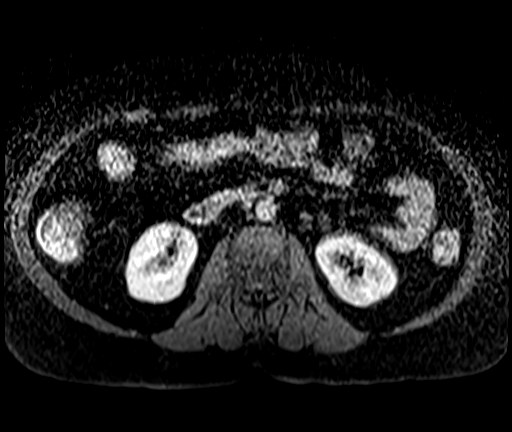
[im 40/80]
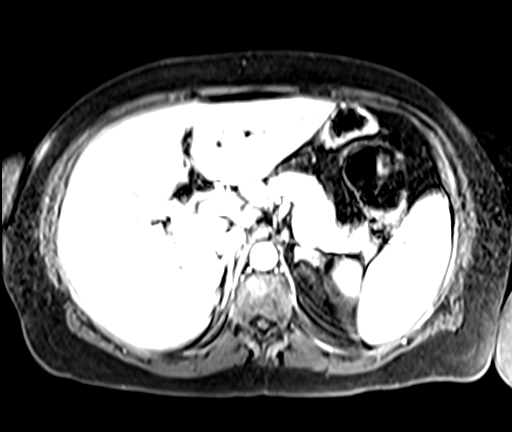
[im 80/80]
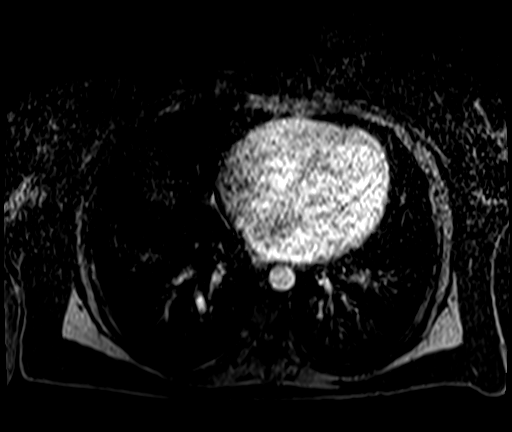

[Series 18: T1 dynamic post-contrast · axial · 2.5mm · 0.74mm/px · z∈[-33,+165]mm · 3 of 80 slices shown (2 of 2)]
[im 1/80]
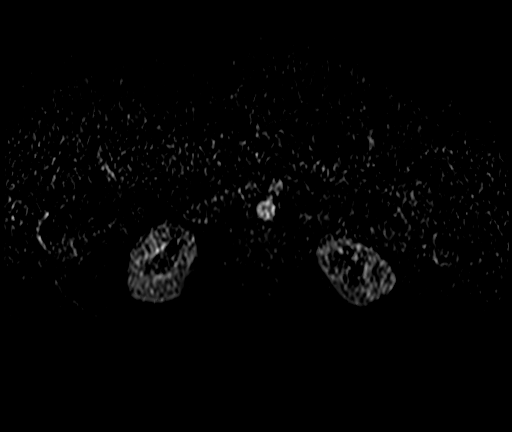
[im 40/80]
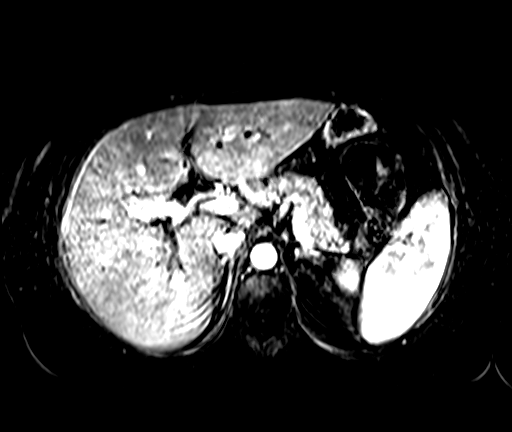
[im 80/80]
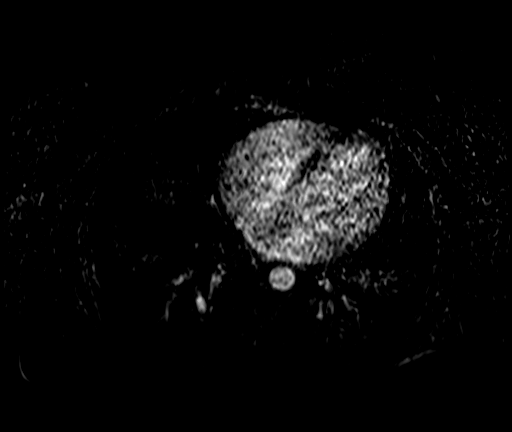

[25 of 48 positions shown; findings below may reference images not displayed]

FINDINGS: Lower chest: No acute abnormality at the lung bases.

Hepatobiliary: Normal liver size and configuration. No hepatic
steatosis. No liver mass. Cholecystectomy. There is new mild diffuse
intrahepatic biliary ductal dilatation. Dilated common bile duct
with CBD diameter 11 mm, increased from 7 mm on 04/24/2018 CT. There
is an obstructing 5 mm choledocholithiasis at the ampulla. No
additional filling defects in the bile ducts. No biliary strictures.

Pancreas: No pancreatic mass or duct dilation.  No pancreas divisum.

Spleen: Normal size. No mass.

Adrenals/Urinary Tract: Normal adrenals. No hydronephrosis. No renal
masses. Normal size kidneys. Limited visualization of known
nonobstructing stone in the lower right kidney.

Stomach/Bowel: Normal non-distended stomach. Visualized small and
large bowel is normal caliber, with no bowel wall thickening.

Vascular/Lymphatic: Normal caliber abdominal aorta. Patent portal,
splenic, hepatic and renal veins. No pathologically enlarged lymph
nodes in the abdomen.

Other: No abdominal ascites or focal fluid collection.

Musculoskeletal: No aggressive appearing focal osseous lesions.
IMPRESSION: 1. Obstructing 5 mm choledocholith at the ampulla. New diffuse
intrahepatic biliary ductal dilatation. Dilated CBD (11 mm
diameter), increased.
2. Nonobstructing lower right nephrolithiasis.

These results will be called to the ordering clinician or
representative by the Radiologist Assistant, and communication
documented in the PACS or zVision Dashboard.

## 2024-02-16 DIAGNOSIS — F319 Bipolar disorder, unspecified: Secondary | ICD-10-CM | POA: Diagnosis not present

## 2024-02-16 DIAGNOSIS — F411 Generalized anxiety disorder: Secondary | ICD-10-CM | POA: Diagnosis not present

## 2024-02-16 DIAGNOSIS — F5101 Primary insomnia: Secondary | ICD-10-CM | POA: Diagnosis not present

## 2024-02-21 DIAGNOSIS — Z6828 Body mass index (BMI) 28.0-28.9, adult: Secondary | ICD-10-CM | POA: Diagnosis not present

## 2024-02-21 DIAGNOSIS — Z713 Dietary counseling and surveillance: Secondary | ICD-10-CM | POA: Diagnosis not present

## 2024-02-21 DIAGNOSIS — E663 Overweight: Secondary | ICD-10-CM | POA: Diagnosis not present

## 2024-03-17 DIAGNOSIS — F411 Generalized anxiety disorder: Secondary | ICD-10-CM | POA: Diagnosis not present

## 2024-03-17 DIAGNOSIS — F5101 Primary insomnia: Secondary | ICD-10-CM | POA: Diagnosis not present

## 2024-03-17 DIAGNOSIS — F319 Bipolar disorder, unspecified: Secondary | ICD-10-CM | POA: Diagnosis not present

## 2024-03-29 DIAGNOSIS — Z713 Dietary counseling and surveillance: Secondary | ICD-10-CM | POA: Diagnosis not present

## 2024-03-29 DIAGNOSIS — Z6826 Body mass index (BMI) 26.0-26.9, adult: Secondary | ICD-10-CM | POA: Diagnosis not present

## 2024-03-29 DIAGNOSIS — E663 Overweight: Secondary | ICD-10-CM | POA: Diagnosis not present

## 2024-04-06 DIAGNOSIS — E663 Overweight: Secondary | ICD-10-CM | POA: Diagnosis not present

## 2024-04-06 DIAGNOSIS — E063 Autoimmune thyroiditis: Secondary | ICD-10-CM | POA: Diagnosis not present

## 2024-04-06 DIAGNOSIS — K5909 Other constipation: Secondary | ICD-10-CM | POA: Diagnosis not present

## 2024-04-06 DIAGNOSIS — N200 Calculus of kidney: Secondary | ICD-10-CM | POA: Diagnosis not present

## 2024-04-06 DIAGNOSIS — E66811 Obesity, class 1: Secondary | ICD-10-CM | POA: Diagnosis not present

## 2024-04-06 DIAGNOSIS — F3174 Bipolar disorder, in full remission, most recent episode manic: Secondary | ICD-10-CM | POA: Diagnosis not present

## 2024-04-09 DIAGNOSIS — Z713 Dietary counseling and surveillance: Secondary | ICD-10-CM | POA: Diagnosis not present

## 2024-04-09 DIAGNOSIS — E663 Overweight: Secondary | ICD-10-CM | POA: Diagnosis not present

## 2024-04-09 DIAGNOSIS — Z6826 Body mass index (BMI) 26.0-26.9, adult: Secondary | ICD-10-CM | POA: Diagnosis not present

## 2024-04-13 DIAGNOSIS — F319 Bipolar disorder, unspecified: Secondary | ICD-10-CM | POA: Diagnosis not present

## 2024-04-13 DIAGNOSIS — F411 Generalized anxiety disorder: Secondary | ICD-10-CM | POA: Diagnosis not present

## 2024-04-13 DIAGNOSIS — F5101 Primary insomnia: Secondary | ICD-10-CM | POA: Diagnosis not present

## 2024-04-25 DIAGNOSIS — E663 Overweight: Secondary | ICD-10-CM | POA: Diagnosis not present

## 2024-04-25 DIAGNOSIS — Z713 Dietary counseling and surveillance: Secondary | ICD-10-CM | POA: Diagnosis not present

## 2024-04-25 DIAGNOSIS — Z6826 Body mass index (BMI) 26.0-26.9, adult: Secondary | ICD-10-CM | POA: Diagnosis not present

## 2024-05-05 DIAGNOSIS — Z1231 Encounter for screening mammogram for malignant neoplasm of breast: Secondary | ICD-10-CM | POA: Diagnosis not present

## 2024-05-12 DIAGNOSIS — N951 Menopausal and female climacteric states: Secondary | ICD-10-CM | POA: Diagnosis not present

## 2024-05-14 DIAGNOSIS — Z713 Dietary counseling and surveillance: Secondary | ICD-10-CM | POA: Diagnosis not present

## 2024-05-14 DIAGNOSIS — Z6826 Body mass index (BMI) 26.0-26.9, adult: Secondary | ICD-10-CM | POA: Diagnosis not present

## 2024-05-20 DIAGNOSIS — R070 Pain in throat: Secondary | ICD-10-CM | POA: Diagnosis not present

## 2024-05-20 DIAGNOSIS — E063 Autoimmune thyroiditis: Secondary | ICD-10-CM | POA: Diagnosis not present

## 2024-05-21 DIAGNOSIS — Z6829 Body mass index (BMI) 29.0-29.9, adult: Secondary | ICD-10-CM | POA: Diagnosis not present

## 2024-05-21 DIAGNOSIS — Z713 Dietary counseling and surveillance: Secondary | ICD-10-CM | POA: Diagnosis not present

## 2024-06-04 DIAGNOSIS — F411 Generalized anxiety disorder: Secondary | ICD-10-CM | POA: Diagnosis not present

## 2024-06-04 DIAGNOSIS — F410 Panic disorder [episodic paroxysmal anxiety] without agoraphobia: Secondary | ICD-10-CM | POA: Diagnosis not present

## 2024-06-04 DIAGNOSIS — F50812 Binge eating disorder, severe: Secondary | ICD-10-CM | POA: Diagnosis not present

## 2024-06-04 DIAGNOSIS — F5021 Bulimia nervosa, mild: Secondary | ICD-10-CM | POA: Diagnosis not present

## 2024-06-08 DIAGNOSIS — Z6829 Body mass index (BMI) 29.0-29.9, adult: Secondary | ICD-10-CM | POA: Diagnosis not present

## 2024-06-08 DIAGNOSIS — Z713 Dietary counseling and surveillance: Secondary | ICD-10-CM | POA: Diagnosis not present

## 2024-06-14 DIAGNOSIS — F411 Generalized anxiety disorder: Secondary | ICD-10-CM | POA: Diagnosis not present

## 2024-06-14 DIAGNOSIS — F5101 Primary insomnia: Secondary | ICD-10-CM | POA: Diagnosis not present

## 2024-06-14 DIAGNOSIS — F319 Bipolar disorder, unspecified: Secondary | ICD-10-CM | POA: Diagnosis not present

## 2024-06-18 DIAGNOSIS — Z6829 Body mass index (BMI) 29.0-29.9, adult: Secondary | ICD-10-CM | POA: Diagnosis not present

## 2024-06-18 DIAGNOSIS — Z713 Dietary counseling and surveillance: Secondary | ICD-10-CM | POA: Diagnosis not present

## 2024-07-02 DIAGNOSIS — Z6829 Body mass index (BMI) 29.0-29.9, adult: Secondary | ICD-10-CM | POA: Diagnosis not present

## 2024-07-02 DIAGNOSIS — Z713 Dietary counseling and surveillance: Secondary | ICD-10-CM | POA: Diagnosis not present

## 2024-07-04 DIAGNOSIS — S93492A Sprain of other ligament of left ankle, initial encounter: Secondary | ICD-10-CM | POA: Diagnosis not present

## 2024-07-04 DIAGNOSIS — M25572 Pain in left ankle and joints of left foot: Secondary | ICD-10-CM | POA: Diagnosis not present

## 2024-07-16 DIAGNOSIS — Z6826 Body mass index (BMI) 26.0-26.9, adult: Secondary | ICD-10-CM | POA: Diagnosis not present

## 2024-07-16 DIAGNOSIS — F319 Bipolar disorder, unspecified: Secondary | ICD-10-CM | POA: Diagnosis not present

## 2024-07-16 DIAGNOSIS — F410 Panic disorder [episodic paroxysmal anxiety] without agoraphobia: Secondary | ICD-10-CM | POA: Diagnosis not present

## 2024-07-16 DIAGNOSIS — G4709 Other insomnia: Secondary | ICD-10-CM | POA: Diagnosis not present

## 2024-07-16 DIAGNOSIS — Z713 Dietary counseling and surveillance: Secondary | ICD-10-CM | POA: Diagnosis not present

## 2024-07-16 DIAGNOSIS — F411 Generalized anxiety disorder: Secondary | ICD-10-CM | POA: Diagnosis not present

## 2024-07-16 DIAGNOSIS — E663 Overweight: Secondary | ICD-10-CM | POA: Diagnosis not present
